# Patient Record
Sex: Female | Born: 1960 | Race: White | Hispanic: No | Marital: Married | Smoking: Never smoker
Health system: Southern US, Community
[De-identification: ages and names within clinical notes are randomized; demographics above are authoritative.]

## PROBLEM LIST (undated history)

## (undated) DIAGNOSIS — D649 Anemia, unspecified: Secondary | ICD-10-CM

## (undated) DIAGNOSIS — D219 Benign neoplasm of connective and other soft tissue, unspecified: Secondary | ICD-10-CM

## (undated) DIAGNOSIS — Z789 Other specified health status: Secondary | ICD-10-CM

## (undated) HISTORY — PX: BUNIONECTOMY: SHX129

---

## 2000-07-02 ENCOUNTER — Encounter: Admission: RE | Admit: 2000-07-02 | Discharge: 2000-07-02 | Payer: Self-pay | Admitting: Sports Medicine

## 2000-11-22 ENCOUNTER — Encounter: Payer: Self-pay | Admitting: Obstetrics and Gynecology

## 2000-11-22 ENCOUNTER — Encounter: Admission: RE | Admit: 2000-11-22 | Discharge: 2000-11-22 | Payer: Self-pay | Admitting: Obstetrics and Gynecology

## 2001-03-06 ENCOUNTER — Encounter: Payer: Self-pay | Admitting: Obstetrics and Gynecology

## 2001-03-06 ENCOUNTER — Encounter: Admission: RE | Admit: 2001-03-06 | Discharge: 2001-03-06 | Payer: Self-pay | Admitting: Obstetrics and Gynecology

## 2004-10-10 ENCOUNTER — Other Ambulatory Visit: Admission: RE | Admit: 2004-10-10 | Discharge: 2004-10-10 | Payer: Self-pay | Admitting: Obstetrics and Gynecology

## 2005-01-09 ENCOUNTER — Ambulatory Visit: Payer: Self-pay | Admitting: Sports Medicine

## 2005-01-23 ENCOUNTER — Ambulatory Visit: Payer: Self-pay | Admitting: Sports Medicine

## 2006-07-09 ENCOUNTER — Ambulatory Visit: Payer: Self-pay | Admitting: Sports Medicine

## 2006-07-09 DIAGNOSIS — M79609 Pain in unspecified limb: Secondary | ICD-10-CM

## 2007-09-03 ENCOUNTER — Ambulatory Visit: Payer: Self-pay | Admitting: Sports Medicine

## 2007-09-03 DIAGNOSIS — M216X9 Other acquired deformities of unspecified foot: Secondary | ICD-10-CM

## 2011-05-31 ENCOUNTER — Encounter (HOSPITAL_BASED_OUTPATIENT_CLINIC_OR_DEPARTMENT_OTHER): Payer: Self-pay | Admitting: Certified Registered Nurse Anesthetist

## 2011-05-31 ENCOUNTER — Encounter (HOSPITAL_BASED_OUTPATIENT_CLINIC_OR_DEPARTMENT_OTHER)
Admission: RE | Disposition: A | Discharge status: Home / Self Care | Payer: Self-pay | Source: Ambulatory Visit | Attending: Orthopedic Surgery

## 2011-05-31 ENCOUNTER — Encounter (HOSPITAL_BASED_OUTPATIENT_CLINIC_OR_DEPARTMENT_OTHER): Payer: Self-pay | Admitting: *Deleted

## 2011-05-31 ENCOUNTER — Ambulatory Visit (HOSPITAL_BASED_OUTPATIENT_CLINIC_OR_DEPARTMENT_OTHER): Payer: Managed Care, Other (non HMO) | Admitting: Certified Registered Nurse Anesthetist

## 2011-05-31 ENCOUNTER — Ambulatory Visit (HOSPITAL_BASED_OUTPATIENT_CLINIC_OR_DEPARTMENT_OTHER)
Admission: RE | Admit: 2011-05-31 | Discharge: 2011-05-31 | Disposition: A | Discharge status: Home / Self Care | Payer: Managed Care, Other (non HMO) | Source: Ambulatory Visit | Attending: Orthopedic Surgery | Admitting: Orthopedic Surgery

## 2011-05-31 DIAGNOSIS — W540XXA Bitten by dog, initial encounter: Secondary | ICD-10-CM | POA: Insufficient documentation

## 2011-05-31 DIAGNOSIS — S5400XA Injury of ulnar nerve at forearm level, unspecified arm, initial encounter: Secondary | ICD-10-CM | POA: Insufficient documentation

## 2011-05-31 DIAGNOSIS — S61209A Unspecified open wound of unspecified finger without damage to nail, initial encounter: Secondary | ICD-10-CM | POA: Insufficient documentation

## 2011-05-31 DIAGNOSIS — L089 Local infection of the skin and subcutaneous tissue, unspecified: Secondary | ICD-10-CM

## 2011-05-31 HISTORY — DX: Other specified health status: Z78.9

## 2011-05-31 HISTORY — PX: I&D EXTREMITY: SHX5045

## 2011-05-31 SURGERY — IRRIGATION AND DEBRIDEMENT EXTREMITY
Anesthesia: Monitor Anesthesia Care | Site: Finger | Laterality: Right | Wound class: Dirty or Infected

## 2011-05-31 MED ORDER — FENTANYL CITRATE 0.05 MG/ML IJ SOLN
INTRAMUSCULAR | Status: DC | PRN
  Administered 2011-05-31: 50 ug via INTRAVENOUS

## 2011-05-31 MED ORDER — ONDANSETRON HCL 4 MG/2ML IJ SOLN
INTRAMUSCULAR | Status: DC | PRN
  Administered 2011-05-31: 4 mg via INTRAVENOUS

## 2011-05-31 MED ORDER — 0.9 % SODIUM CHLORIDE (POUR BTL) OPTIME
TOPICAL | Status: DC | PRN
  Administered 2011-05-31: 100 mL

## 2011-05-31 MED ORDER — DOCUSATE SODIUM 100 MG PO CAPS: 100.0000 mg | ORAL_CAPSULE | Freq: Two times a day (BID) | ORAL | Status: AC

## 2011-05-31 MED ORDER — LIDOCAINE HCL 1 % IJ SOLN
INTRAMUSCULAR | Status: DC | PRN
  Administered 2011-05-31: 16:00:00 via INTRAMUSCULAR

## 2011-05-31 MED ORDER — PROPOFOL 10 MG/ML IV EMUL
INTRAVENOUS | Status: DC | PRN
  Administered 2011-05-31: 75 ug/kg/min via INTRAVENOUS

## 2011-05-31 MED ORDER — LORAZEPAM 2 MG/ML IJ SOLN: 1.0000 mg | Freq: Once | INTRAMUSCULAR | Status: DC | PRN

## 2011-05-31 MED ORDER — HYDROCODONE-ACETAMINOPHEN 5-500 MG PO TABS: 1.0000 | ORAL_TABLET | Freq: Four times a day (QID) | ORAL | Status: AC | PRN

## 2011-05-31 MED ORDER — LACTATED RINGERS IV SOLN
INTRAVENOUS | Status: DC
  Administered 2011-05-31 (×2): via INTRAVENOUS

## 2011-05-31 MED ORDER — MIDAZOLAM HCL 5 MG/5ML IJ SOLN
INTRAMUSCULAR | Status: DC | PRN
  Administered 2011-05-31: 2 mg via INTRAVENOUS

## 2011-05-31 MED ORDER — FENTANYL CITRATE 0.05 MG/ML IJ SOLN: 50.0000 ug | INTRAMUSCULAR | Status: DC | PRN

## 2011-05-31 MED ORDER — MIDAZOLAM HCL 2 MG/2ML IJ SOLN: 1.0000 mg | INTRAMUSCULAR | Status: DC | PRN

## 2011-05-31 MED ORDER — CEFAZOLIN SODIUM 1-5 GM-% IV SOLN
INTRAVENOUS | Status: DC | PRN
  Administered 2011-05-31: 1 g via INTRAVENOUS

## 2011-05-31 MED ORDER — HYDROMORPHONE HCL PF 1 MG/ML IJ SOLN: 0.2500 mg | INTRAMUSCULAR | Status: DC | PRN

## 2011-05-31 MED ORDER — LIDOCAINE HCL (CARDIAC) 20 MG/ML IV SOLN
INTRAVENOUS | Status: DC | PRN
  Administered 2011-05-31: 60 mg via INTRAVENOUS

## 2011-05-31 SURGICAL SUPPLY — 48 items
BANDAGE CONFORM 3  STR LF (GAUZE/BANDAGES/DRESSINGS) ×2 IMPLANT
BANDAGE ELASTIC 3 VELCRO ST LF (GAUZE/BANDAGES/DRESSINGS) IMPLANT
BANDAGE GAUZE ELAST BULKY 4 IN (GAUZE/BANDAGES/DRESSINGS) ×2 IMPLANT
BLADE SURG 15 STRL LF DISP TIS (BLADE) ×1 IMPLANT
BLADE SURG 15 STRL SS (BLADE) ×1
BNDG COHESIVE 1X5 TAN STRL LF (GAUZE/BANDAGES/DRESSINGS) ×2 IMPLANT
BNDG COHESIVE 3X5 TAN STRL LF (GAUZE/BANDAGES/DRESSINGS) IMPLANT
BNDG ESMARK 4X9 LF (GAUZE/BANDAGES/DRESSINGS) IMPLANT
BRUSH SCRUB EZ PLAIN DRY (MISCELLANEOUS) ×2 IMPLANT
CANISTER SUCTION 1200CC (MISCELLANEOUS) ×2 IMPLANT
CLOTH BEACON ORANGE TIMEOUT ST (SAFETY) ×2 IMPLANT
CORDS BIPOLAR (ELECTRODE) ×2 IMPLANT
COVER MAYO STAND STRL (DRAPES) ×2 IMPLANT
COVER TABLE BACK 60X90 (DRAPES) ×2 IMPLANT
CUFF TOURNIQUET SINGLE 18IN (TOURNIQUET CUFF) ×2 IMPLANT
DECANTER SPIKE VIAL GLASS SM (MISCELLANEOUS) ×2 IMPLANT
DRAIN PENROSE 1/4X12 LTX STRL (WOUND CARE) IMPLANT
DRAPE EXTREMITY T 121X128X90 (DRAPE) ×2 IMPLANT
DRAPE SURG 17X23 STRL (DRAPES) ×2 IMPLANT
DRSG EMULSION OIL 3X3 NADH (GAUZE/BANDAGES/DRESSINGS) ×2 IMPLANT
GAUZE SPONGE 4X4 16PLY XRAY LF (GAUZE/BANDAGES/DRESSINGS) IMPLANT
GLOVE BIO SURGEON STRL SZ 6.5 (GLOVE) ×2 IMPLANT
GLOVE BIO SURGEON STRL SZ8 (GLOVE) ×2 IMPLANT
GLOVE BIOGEL M STRL SZ7.5 (GLOVE) ×2 IMPLANT
GOWN PREVENTION PLUS XLARGE (GOWN DISPOSABLE) IMPLANT
GOWN PREVENTION PLUS XXLARGE (GOWN DISPOSABLE) IMPLANT
LOOP VESSEL MAXI BLUE (MISCELLANEOUS) IMPLANT
NEEDLE 27GAX1X1/2 (NEEDLE) ×2 IMPLANT
NEEDLE HYPO 22GX1.5 SAFETY (NEEDLE) IMPLANT
NEEDLE HYPO 25X1 1.5 SAFETY (NEEDLE) IMPLANT
PACK BASIN DAY SURGERY FS (CUSTOM PROCEDURE TRAY) ×2 IMPLANT
PAD ALCOHOL SWAB (MISCELLANEOUS) ×2 IMPLANT
PAD CAST 3X4 CTTN HI CHSV (CAST SUPPLIES) ×1 IMPLANT
PADDING CAST ABS 4INX4YD NS (CAST SUPPLIES)
PADDING CAST ABS COTTON 4X4 ST (CAST SUPPLIES) IMPLANT
PADDING CAST COTTON 3X4 STRL (CAST SUPPLIES) ×1
SPLINT FIBERGLASS 3X35 (CAST SUPPLIES) IMPLANT
SPLINT PLASTER CAST XFAST 3X15 (CAST SUPPLIES) IMPLANT
SPLINT PLASTER XTRA FASTSET 3X (CAST SUPPLIES)
STOCKINETTE 4X48 STRL (DRAPES) ×2 IMPLANT
SUT PROLENE 4 0 PS 2 18 (SUTURE) ×2 IMPLANT
SUT VIC AB 4-0 SH 27 (SUTURE)
SUT VIC AB 4-0 SH 27XANBCTRL (SUTURE) IMPLANT
SYR BULB 3OZ (MISCELLANEOUS) ×2 IMPLANT
SYR CONTROL 10ML LL (SYRINGE) ×2 IMPLANT
TOWEL OR 17X24 6PK STRL BLUE (TOWEL DISPOSABLE) ×2 IMPLANT
TRAY DSU PREP LF (CUSTOM PROCEDURE TRAY) ×2 IMPLANT
UNDERPAD 30X30 INCONTINENT (UNDERPADS AND DIAPERS) ×2 IMPLANT

## 2011-05-31 NOTE — Discharge Instructions (Signed)
KEEP BANDAGE CLEAN AND DRY CALL OFFICE FOR F/U APPT [phone] on 06/01/2011  Need to see therapist tomorrow for wound care Ext 1601 KEEP HAND ELEVATED ABOVE HEART OK TO APPLY ICE TO OPERATIVE AREA CONTACT OFFICE IF ANY WORSENING PAIN OR CONCERNS. Post Anesthesia Home Care Instructions  Activity: Get plenty of rest for the remainder of the day. A responsible adult should stay with you for 24 hours following the procedure.  For the next 24 hours, DO NOT: -Drive a car -Advertising copywriter -Drink alcoholic beverages -Take any medication unless instructed by your physician -Make any legal decisions or sign important papers.  Meals: Start with liquid foods such as gelatin or soup. Progress to regular foods as tolerated. Avoid greasy, spicy, heavy foods. If nausea and/or vomiting occur, drink only clear liquids until the nausea and/or vomiting subsides. Call your physician if vomiting continues.  Special Instructions/Symptoms: Your throat may feel dry or sore from the anesthesia or the breathing tube placed in your throat during surgery. If this causes discomfort, gargle with warm salt water. The discomfort should disappear within 24 hours.

## 2011-05-31 NOTE — H&P (Signed)
URI COVEY is an 51 y.o. female.   Chief Complaint: RIGHT INDEX FINGER PAIN AND SWELLING HPI: DOG BITE SEVERAL DAYS AGO WITH WORSENING PAIN AND SWELLING SEEN IN OFFICE TODAY AND CONCERNED ABOUT INFECTION NO PREVIOUS INJURY TO FINGER PT HERE FOR SURGERY SCHEDULED ON URGENT BASIS GIVEN CONCERN FOR INFECTION  Past Medical History  Diagnosis Date  . No pertinent past medical history     Past Surgical History  Procedure Date  . Cesarean section   . Bunionectomy     History reviewed. No pertinent family history. Social History:  reports that she has never smoked. She has never used smokeless tobacco. She reports that she drinks about 3 ounces of alcohol per week. She reports that she does not use illicit drugs.  Allergies: No Known Allergies  Medications Prior to Admission  Medication Sig Dispense Refill  . amoxicillin-clavulanate (AUGMENTIN) 875-125 MG per tablet Take 1 tablet by mouth 2 (two) times daily.      . Multiple Vitamins-Minerals (MULTIVITAMIN WITH MINERALS) tablet Take 1 tablet by mouth daily.      . traMADol (ULTRAM) 50 MG tablet Take 50 mg by mouth every 6 (six) hours as needed.        No results found for this or any previous visit (from the past 48 hour(s)). No results found.  NO RECENT ILLNESSES OR HOSPITALIZATIONS  Blood pressure 137/82, pulse 66, temperature 98.5 F (36.9 C), temperature source Oral, resp. rate 16, height 5\' 2"  (1.575 m), weight 53.071 kg (117 lb), last menstrual period 05/06/2011, SpO2 98.00%. General Appearance:  Alert, cooperative, no distress, appears stated age  Head:  Normocephalic, without obvious abnormality, atraumatic  Eyes:  Pupils equal, conjunctiva/corneas clear,         Throat: Lips, mucosa, and tongue normal; teeth and gums normal  Neck: No visible masses     Lungs:   respirations unlabored  Chest Wall:  No tenderness or deformity  Heart:  Regular rate and rhythm,  Abdomen:   Soft, non-tender,         Extremities:  RIGHT INDEX AND RING FINGER IN DRESSINGS, PT WAS EXAMINED IN OFFICE TODAY.   Pulses: 2+ and symmetric  Skin: Skin color, texture, turgor normal, no rashes or lesions     Neurologic: Normal    Assessment/Plan RIGHT INDEX FINGER DOG BITE WITH INFECTION  RIGHT INDEX FINGER IRRIGATION AND DEBRIDEMENT  R/B/A DISCUSSED WITH PT IN OFFICE.  PT VOICED UNDERSTANDING OF PLAN CONSENT SIGNED DAY OF SURGERY PT SEEN AND EXAMINED PRIOR TO OPERATIVE PROCEDURE/DAY OF SURGERY SITE MARKED. QUESTIONS ANSWERED WILL GO HOME FOLLOWING SURGERY  Sharma Covert 05/31/2011, 2:36 PM

## 2011-05-31 NOTE — Brief Op Note (Signed)
05/31/2011  4:54 PM  PATIENT:  Sylvia Henderson  51 y.o. female  PRE-OPERATIVE DIAGNOSIS:  Infected dog bites right index and ring finger  POST-OPERATIVE DIAGNOSIS:  Infected dog bites right index and ring finger  PROCEDURE:  Procedure(s) (LRB): IRRIGATION AND DEBRIDEMENT EXTREMITY (Right)  SURGEON:  Surgeon(s) and Role:    * Sharma Covert, MD - Primary  PHYSICIAN ASSISTANT:   ASSISTANTS: none   ANESTHESIA:   MAC  EBL:  Total I/O In: 900 [I.V.:900] Out: -   BLOOD ADMINISTERED:none  DRAINS: none   LOCAL MEDICATIONS USED:  MARCAINE     SPECIMEN:  No Specimen  DISPOSITION OF SPECIMEN:  N/A  COUNTS:  YES  TOURNIQUET:   Total Tourniquet Time Documented: Forearm (Right) - 9 minutes  DICTATION: .Other Dictation: Dictation Number [id number]  PLAN OF CARE: Discharge to home after PACU  PATIENT DISPOSITION:  PACU - hemodynamically stable.   Delay start of Pharmacological VTE agent (>24hrs) due to surgical blood loss or risk of bleeding: not applicable

## 2011-05-31 NOTE — Anesthesia Procedure Notes (Signed)
Procedure Name: MAC Date/Time: 05/31/2011 4:14 PM Performed by: Areej Tayler D Pre-anesthesia Checklist: Patient identified, Emergency Drugs available, Suction available, Patient being monitored and Timeout performed Patient Re-evaluated:Patient Re-evaluated prior to inductionOxygen Delivery Method: Simple face mask

## 2011-05-31 NOTE — Anesthesia Postprocedure Evaluation (Signed)
  Anesthesia Post-op Note  Patient: Sylvia Henderson  Procedure(s) Performed: Procedure(s) (LRB): IRRIGATION AND DEBRIDEMENT EXTREMITY (Right)  Patient Location: PACU  Anesthesia Type: MAC  Level of Consciousness: awake  Airway and Oxygen Therapy: Patient Spontanous Breathing  Post-op Pain: mild  Post-op Assessment: Post-op Vital signs reviewed, Patient's Cardiovascular Status Stable, Respiratory Function Stable, Patent Airway, No signs of Nausea or vomiting, Adequate PO intake and Pain level controlled  Post-op Vital Signs: stable  Complications: No apparent anesthesia complications

## 2011-05-31 NOTE — Transfer of Care (Signed)
Immediate Anesthesia Transfer of Care Note  Patient: Sylvia Henderson  Procedure(s) Performed: Procedure(s) (LRB): IRRIGATION AND DEBRIDEMENT EXTREMITY (Right)  Patient Location: PACU  Anesthesia Type: MAC  Level of Consciousness: awake, alert , oriented and pateint uncooperative  Airway & Oxygen Therapy: Patient Spontanous Breathing  Post-op Assessment: Report given to PACU RN and Post -op Vital signs reviewed and stable  Post vital signs: Reviewed and stable  Complications: No apparent anesthesia complications

## 2011-05-31 NOTE — Anesthesia Preprocedure Evaluation (Addendum)
Anesthesia Evaluation  Patient identified by MRN, date of birth, ID band Patient awake    Reviewed: Allergy & Precautions, H&P , NPO status , Patient's Chart, lab work & pertinent test results  Airway Mallampati: I TM Distance: >3 FB Neck ROM: Full    Dental   Pulmonary    Pulmonary exam normal       Cardiovascular     Neuro/Psych    GI/Hepatic   Endo/Other    Renal/GU      Musculoskeletal   Abdominal   Peds  Hematology   Anesthesia Other Findings   Reproductive/Obstetrics                           Anesthesia Physical Anesthesia Plan  ASA: I  Anesthesia Plan: MAC   Post-op Pain Management:    Induction: Intravenous  Airway Management Planned: Simple Face Mask  Additional Equipment:   Intra-op Plan:   Post-operative Plan:   Informed Consent: I have reviewed the patients History and Physical, chart, labs and discussed the procedure including the risks, benefits and alternatives for the proposed anesthesia with the patient or authorized representative who has indicated his/her understanding and acceptance.     Plan Discussed with: CRNA and Surgeon  Anesthesia Plan Comments:         Anesthesia Quick Evaluation

## 2011-06-01 ENCOUNTER — Encounter (HOSPITAL_BASED_OUTPATIENT_CLINIC_OR_DEPARTMENT_OTHER): Payer: Self-pay | Admitting: Orthopedic Surgery

## 2011-06-01 LAB — POCT HEMOGLOBIN-HEMACUE: Hemoglobin: 13.9 g/dL (ref 12.0–15.0)

## 2011-06-01 NOTE — Op Note (Signed)
NAMEBRIZEYDA, HOLTMEYER NO.:  [id number]  MEDICAL RECORD NO.:  [id number]  LOCATION:                                 FACILITY:  PHYSICIAN:  Madelynn Done, MD       DATE OF BIRTH:  DATE OF PROCEDURE:  05/31/2011 DATE OF DISCHARGE:                              OPERATIVE REPORT   PREOPERATIVE DIAGNOSIS:  Right index finger dog bite with infection.  POSTOPERATIVE DIAGNOSIS:  Right index finger dog bite with infection.  ATTENDING PHYSICIAN:  Sharma Covert IV, MD, who scrubbed and present for the entire procedure.  ASSISTANT SURGEON:  None.  ANESTHESIA:  1% Xylocaine, 0.25% Marcaine local block with IV sedation.  SURGICAL INDICATIONS:  Mrs. Bankson is a 51 year old female who sustained a dog bite.  The patient presented to the office with worsening infection to the index finger.  The patient was recommended to undergo the above procedure.  Risks, benefits, and alternatives were discussed in detail with the patient.  Signed informed consent was obtained.  Risks include, but not limited to bleeding, infection, damage to nearby nerves, arteries, or tendons, loss of motion of wrist and digits, and need for further surgical intervention.  SURGICAL PROCEDURE: 1. Right index finger debridement of skin and subcutaneous tissue. 2. Right index finger flexor sheath exploration and drainage. 3. Right index finger ulnar digital nerve neurolysis. 4. Right index finger distal interphalangeal joint arthrotomy and     drainage.  DESCRIPTION OF PROCEDURE:  The patient was properly identified in the preoperative holding area, marked with a permanent marker made on the right index finger to indicate the correct operative site.  The patient was then brought back to the operating room and placed supine on the anesthesia room table with MAC anesthesia was performed.  Local anesthetic was performed, the patient tolerated this well.  A well- padded tourniquet was then placed in  the right forearm and sealed with 1000-drape.  The right upper extremity was then prepped and draped in normal sterile fashion.  Time-out was called, correct side was identified and the procedure was then begun.  Attention was then turned to the right index finger.  The open wound that was over the volar aspect of the distal interphalangeal joint was then extended proximally. The tourniquet was then insufflated.  Dissection was then carried down through the skin and subcutaneous tissues.  Following this, the flexor sheath had been invaded by the dog bite.  The patient did sustain injury to the FDP, this was a superficial tearing of the FDP and this was debrided back to a stable rim to prevent any type of triggering.  After excisional debridement of the FDP tendon, the skin and subcutaneous tissue were then sharply debrided with a sharp scissors and a knife. After excisional debridement was carried out, drainage of the flexor sheath was then carried out.  After thorough irrigation and drainage of the flexor sheath, the patient did have a competent radial neurovascular bundle, but did have the ulnar nerve was markedly edematous, appeared the median zone of injury though the ulnar digital nerve.  Careful dissection was then carried out to freeing the nerve from the area. Digital nerve  neurolysis was carried out.  Following this, the wound was then thoroughly irrigated.  Attention was then turned to the dorsal aspect.  The other puncture wound was then approached through, this extended all the way down to the distal interphalangeal joint.  Opening of the distal interphalangeal joint was then further extended and the joint was then thoroughly irrigated.  The joint lavage was then carried out of the arthrotomy, copious irrigation done throughout.  Tourniquet deflated.  There was good fusion of fingertip.  The wounds were then loosely reapproximated with two 4-0 Prolene sutures.  Adaptic  dressing and sterile compressive bandage was then applied.  The patient tolerated the procedure well, returned to recovery room in good condition.  POSTOPERATIVE PLAN:  The patient was discharged to home, see me back in the office tomorrow for wound care, whirlpool.  I plan to see her back within 4 days for repeat wound check and eval.  Continue on the oral antibiotics and pain medication.     Madelynn Done, MD     FWO/MEDQ  D:  05/31/2011  T:  05/31/2011  Job:  [id number]

## 2013-02-10 ENCOUNTER — Telehealth: Payer: Self-pay | Admitting: Oncology

## 2013-02-10 NOTE — Telephone Encounter (Signed)
C/D 02/10/13 for appt. 02/11/13

## 2013-02-10 NOTE — Telephone Encounter (Signed)
NEW PATIENT SCHEDULED FOR 02/11 @ 10:30 W/DR. SHADAD.  [place] PACKET MAILED.

## 2013-02-11 ENCOUNTER — Ambulatory Visit: Payer: Managed Care, Other (non HMO) | Admitting: Oncology

## 2013-02-11 ENCOUNTER — Ambulatory Visit: Payer: Managed Care, Other (non HMO)

## 2013-02-11 ENCOUNTER — Other Ambulatory Visit: Payer: Managed Care, Other (non HMO)

## 2013-03-04 ENCOUNTER — Other Ambulatory Visit (HOSPITAL_COMMUNITY)
Admission: RE | Admit: 2013-03-04 | Discharge: 2013-03-04 | Disposition: A | Discharge status: Home / Self Care | Payer: Managed Care, Other (non HMO) | Source: Ambulatory Visit | Attending: Obstetrics & Gynecology | Admitting: Obstetrics & Gynecology

## 2013-03-04 ENCOUNTER — Other Ambulatory Visit: Payer: Self-pay | Admitting: Obstetrics & Gynecology

## 2013-03-04 DIAGNOSIS — Z01419 Encounter for gynecological examination (general) (routine) without abnormal findings: Secondary | ICD-10-CM | POA: Insufficient documentation

## 2013-03-04 DIAGNOSIS — Z1151 Encounter for screening for human papillomavirus (HPV): Secondary | ICD-10-CM | POA: Insufficient documentation

## 2013-11-03 ENCOUNTER — Other Ambulatory Visit: Payer: Self-pay | Admitting: Obstetrics & Gynecology

## 2013-11-03 DIAGNOSIS — D259 Leiomyoma of uterus, unspecified: Secondary | ICD-10-CM

## 2013-11-09 ENCOUNTER — Other Ambulatory Visit: Payer: Self-pay | Admitting: Obstetrics & Gynecology

## 2013-11-09 DIAGNOSIS — D259 Leiomyoma of uterus, unspecified: Secondary | ICD-10-CM

## 2013-11-10 ENCOUNTER — Ambulatory Visit
Admission: RE | Admit: 2013-11-10 | Discharge: 2013-11-10 | Disposition: A | Discharge status: Home / Self Care | Payer: Managed Care, Other (non HMO) | Source: Ambulatory Visit | Attending: Obstetrics & Gynecology | Admitting: Obstetrics & Gynecology

## 2013-11-10 DIAGNOSIS — D259 Leiomyoma of uterus, unspecified: Secondary | ICD-10-CM

## 2013-11-10 HISTORY — DX: Benign neoplasm of connective and other soft tissue, unspecified: D21.9

## 2013-11-10 HISTORY — DX: Anemia, unspecified: D64.9

## 2013-11-10 NOTE — Consult Note (Signed)
Chief Complaint: Chief Complaint  Patient presents with  . Advice Only    Consult for [place]    Referring Physician(s): Ozan,Jennifer M  History of Present Illness: Sylvia Henderson is a 53 y.o. female presenting in consultation for a potential uterine fibroid embolization.   The patient has been experiencing heavy bleeding during her regular cycles, starting in 2013. She states that her menses lasts usually as long or longer than 7 days, with a most recent episode of continual bleeding with clots for nearly 3 weeks.  She reports needing to use both tampons and pads simultaneously.  She denies any painful bleeding. Other than this most recent episode of nearly continuous bleeding for 3 weeks, she reports regular periods without interval bleeding.  She does report bulk classification symptoms, being able to palpate the uterus/fibroids. She also reports she has been having increasing frequency with urination, and heaviness in her pelvis. She denies any pain that she can attribute to her fibroids..  The patient participates in competitive triathlon and marathon running. Her symptoms have become lifestyle limiting, given that she finds herself bleeding during her workouts and competitions. She arrives for the interview having researched uterine fibroid embolization as an alternative to hysterectomy, given her previous experiences with prior surgeries.  The patient has 2 children, who are grown age, and does not desire further pregnancies.  Past Medical History  Diagnosis Date  . No pertinent past medical history   . Fibroids   . Anemia     Past Surgical History  Procedure Laterality Date  . Cesarean section    . Bunionectomy    . I&d extremity  05/31/2011    Procedure: IRRIGATION AND DEBRIDEMENT EXTREMITY;  Surgeon: Linna Hoff, MD;  Location: [facility];  Service: Orthopedics;  Laterality: Right;  Irrigation and Debridement Right index finger    Allergies: Review  of patient's allergies indicates no known allergies.  She denies any knowledge of a known contrast allergy.  Medications: Prior to Admission medications   Medication Sig Start Date End Date Taking? Authorizing Provider  Multiple Vitamins-Minerals (MULTIVITAMIN WITH MINERALS) tablet Take 1 tablet by mouth daily.   Yes Historical Provider, MD  amoxicillin-clavulanate (AUGMENTIN) 875-125 MG per tablet Take 1 tablet by mouth 2 (two) times daily.    Historical Provider, MD    No family history on file.  History   Social History  . Marital Status: Married    Spouse Name: N/A    Number of Children: N/A  . Years of Education: N/A   Social History Main Topics  . Smoking status: Never Smoker   . Smokeless tobacco: Never Used  . Alcohol Use: 3.0 oz/week    5 Glasses of wine per week  . Drug Use: No  . Sexual Activity: Not on file   Other Topics Concern  . Not on file   Social History Narrative    ECOG Status: 1 - Symptomatic.   Review of Systems: A 12 point ROS discussed and pertinent positives are indicated in the HPI above.  All other systems are negative.  Review of Systems  All other systems reviewed and are negative.   Vital Signs: BP 145/62 mmHg  Pulse 71  Temp(Src) 97.7 F (36.5 C) (Oral)  Resp 14  Ht 5\' 2"  (1.575 m)  Wt 120 lb (54.432 kg)  BMI 21.94 kg/m2  SpO2 100%  Physical Exam  Constitutional: She is oriented to person, place, and time. She appears well-developed and well-nourished.  HENT:  Head: Normocephalic and atraumatic.  Nose: Nose normal.  Mouth/Throat: Oropharynx is clear and moist.  Eyes: Conjunctivae are normal. Pupils are equal, round, and reactive to light.  Neck: Normal range of motion. Neck supple. No JVD present. No tracheal deviation present. No thyromegaly present.  Cardiovascular: Normal rate, regular rhythm, normal heart sounds and intact distal pulses.  Exam reveals no gallop and no friction rub.   No murmur heard. Pulmonary/Chest:  Breath sounds normal. No stridor. No respiratory distress. She has no wheezes. She has no rales.  Abdominal: Soft. Bowel sounds are normal. She exhibits mass. She exhibits no distension. There is tenderness. There is no rebound and no guarding. No hernia.  Lymphadenopathy:    She has no cervical adenopathy.  Neurological: She is alert and oriented to person, place, and time.  Skin: Skin is warm and dry.  Psychiatric: She has a normal mood and affect. Her behavior is normal. Thought content normal.  Nursing note and vitals reviewed.   Imaging: A pelvic US was performed in her OB/GYN office, though the images are not available for viewing.  This report states that the endometrial canal was not visualized, and that there appeared to be a dominant posterior fibroid.   Labs:  CBC: No results for input(s): WBC, HGB, HCT, PLT in the last 8760 hours.  COAGS: No results for input(s): INR, APTT in the last 8760 hours.  BMP: No results for input(s): NA, K, CL, CO2, GLUCOSE, BUN, CALCIUM, CREATININE, GFRNONAA, GFRAA in the last 8760 hours.  Invalid input(s): CMP  LIVER FUNCTION TESTS: No results for input(s): BILITOT, AST, ALT, ALKPHOS, PROT, ALBUMIN in the last 8760 hours.   Assessment and Plan:  Our patient is a very pleasant 53 year old, premenopausal female with 2 of the 3 classes of symptoms related to uterine fibroids. Her main symptom is abnormal bleeding (menorrhagia), with secondary bulk type symptoms.  I had a long discussion with Ms. Greenlaw regarding the pathology, pathophysiology, natural history, and possible treatment options of her uterine fibroids. Treatment options which were discussed included medical/hormone therapy, surgical, uterine fibroid embolization, and MR directed high-frequency ultrasound.  She desires no further pregnancies, and would prefer a nonsurgical option for treatment of her fibroids. Thus, she is a good candidate for uterine fibroid embolization.  A  full and complete informed consent was discussed with the patient, including the risks of uterine fibroid embolization. Specifically, the risks discussed or bleeding, infection, access site complications, contrast reaction, post embolization syndrome, pain, infection/endometritis, tissue/fluid passage, amenorrhea/early menopause, DVT, and potential need for hysterectomy.  Given her desire to proceed, we will order an MRI to evaluate the burden of fibroids, specifically location within the  uterine tissue, and possibility of pedunculated fibroids.  After completion of her MRI, she would be a potential candidate to proceed with the uterine fibroid embolization before December 31.  Thank you for this interesting consult.  I greatly enjoyed meeting Sylvia Henderson and look forward to participating in their care.  I was able to spend approximally 60 minutes with the patient in clinical consultation, with greater than 50% of time in counseling and coordination of care regarding her symptomatic uterine fibroids.      SignedJohny Shears 11/10/2013, 12:21 PM

## 2013-11-16 ENCOUNTER — Ambulatory Visit
Admission: RE | Admit: 2013-11-16 | Discharge: 2013-11-16 | Disposition: A | Discharge status: Home / Self Care | Payer: Managed Care, Other (non HMO) | Source: Ambulatory Visit | Attending: Obstetrics & Gynecology | Admitting: Obstetrics & Gynecology

## 2013-11-16 DIAGNOSIS — D259 Leiomyoma of uterus, unspecified: Secondary | ICD-10-CM

## 2013-11-16 MED ORDER — GADOBENATE DIMEGLUMINE 529 MG/ML IV SOLN
10.0000 mL | Freq: Once | INTRAVENOUS | Status: AC | PRN
  Administered 2013-11-16: 10 mL via INTRAVENOUS

## 2013-11-17 ENCOUNTER — Telehealth: Payer: Self-pay | Admitting: Radiology

## 2013-11-17 NOTE — Telephone Encounter (Signed)
Disregard

## 2013-12-02 ENCOUNTER — Other Ambulatory Visit: Payer: Self-pay | Admitting: Interventional Radiology

## 2013-12-02 DIAGNOSIS — D251 Intramural leiomyoma of uterus: Secondary | ICD-10-CM

## 2013-12-17 ENCOUNTER — Other Ambulatory Visit: Payer: Self-pay | Admitting: Radiology

## 2013-12-18 ENCOUNTER — Encounter (HOSPITAL_COMMUNITY): Payer: Self-pay

## 2013-12-18 ENCOUNTER — Ambulatory Visit (HOSPITAL_COMMUNITY)
Admission: RE | Admit: 2013-12-18 | Discharge: 2013-12-18 | Disposition: A | Discharge status: Home / Self Care | Payer: Managed Care, Other (non HMO) | Source: Ambulatory Visit | Attending: Interventional Radiology | Admitting: Interventional Radiology

## 2013-12-18 ENCOUNTER — Observation Stay (HOSPITAL_COMMUNITY)
Admission: RE | Admit: 2013-12-18 | Discharge: 2013-12-19 | Disposition: A | Discharge status: Home / Self Care | Payer: Managed Care, Other (non HMO) | Source: Ambulatory Visit | Attending: Interventional Radiology | Admitting: Interventional Radiology

## 2013-12-18 DIAGNOSIS — D251 Intramural leiomyoma of uterus: Secondary | ICD-10-CM | POA: Diagnosis not present

## 2013-12-18 DIAGNOSIS — N920 Excessive and frequent menstruation with regular cycle: Secondary | ICD-10-CM | POA: Insufficient documentation

## 2013-12-18 DIAGNOSIS — D259 Leiomyoma of uterus, unspecified: Secondary | ICD-10-CM | POA: Diagnosis present

## 2013-12-18 LAB — CBC WITH DIFFERENTIAL/PLATELET
Basophils Absolute: 0.1 10*3/uL (ref 0.0–0.1)
Basophils Relative: 1 % (ref 0–1)
EOS PCT: 6 % — AB (ref 0–5)
Eosinophils Absolute: 0.4 10*3/uL (ref 0.0–0.7)
HCT: 32.5 % — ABNORMAL LOW (ref 36.0–46.0)
Hemoglobin: 10.6 g/dL — ABNORMAL LOW (ref 12.0–15.0)
LYMPHS ABS: 1.8 10*3/uL (ref 0.7–4.0)
LYMPHS PCT: 27 % (ref 12–46)
MCH: 28 pg (ref 26.0–34.0)
MCHC: 32.6 g/dL (ref 30.0–36.0)
MCV: 86 fL (ref 78.0–100.0)
Monocytes Absolute: 0.8 10*3/uL (ref 0.1–1.0)
Monocytes Relative: 12 % (ref 3–12)
Neutro Abs: 3.5 10*3/uL (ref 1.7–7.7)
Neutrophils Relative %: 54 % (ref 43–77)
PLATELETS: 341 10*3/uL (ref 150–400)
RBC: 3.78 MIL/uL — AB (ref 3.87–5.11)
RDW: 15.5 % (ref 11.5–15.5)
WBC: 6.5 10*3/uL (ref 4.0–10.5)

## 2013-12-18 LAB — BASIC METABOLIC PANEL
Anion gap: 12 (ref 5–15)
BUN: 16 mg/dL (ref 6–23)
CO2: 23 meq/L (ref 19–32)
Calcium: 9.2 mg/dL (ref 8.4–10.5)
Chloride: 104 mEq/L (ref 96–112)
Creatinine, Ser: 0.93 mg/dL (ref 0.50–1.10)
GFR calc Af Amer: 80 mL/min — ABNORMAL LOW (ref >90)
GFR, EST NON AFRICAN AMERICAN: 69 mL/min — AB (ref >90)
GLUCOSE: 92 mg/dL (ref 70–99)
POTASSIUM: 4 meq/L (ref 3.7–5.3)
SODIUM: 139 meq/L (ref 137–147)

## 2013-12-18 LAB — PROTIME-INR
INR: 1.11 (ref 0.00–1.49)
PROTHROMBIN TIME: 14.4 s (ref 11.6–15.2)

## 2013-12-18 LAB — HCG, SERUM, QUALITATIVE: PREG SERUM: NEGATIVE

## 2013-12-18 LAB — APTT: aPTT: 27 seconds (ref 24–37)

## 2013-12-18 MED ORDER — HYDROCODONE-ACETAMINOPHEN 5-325 MG PO TABS
1.0000 | ORAL_TABLET | ORAL | Status: DC | PRN
  Administered 2013-12-19: 2 via ORAL
  Filled 2013-12-18: qty 2

## 2013-12-18 MED ORDER — IOHEXOL 300 MG/ML  SOLN
80.0000 mL | Freq: Once | INTRAMUSCULAR | Status: AC | PRN
  Administered 2013-12-18: 1 mL via INTRA_ARTERIAL

## 2013-12-18 MED ORDER — NITROGLYCERIN 1 MG/10 ML FOR IR/CATH LAB
INTRA_ARTERIAL | Status: AC | PRN
  Administered 2013-12-18: 150 ug via INTRA_ARTERIAL
  Administered 2013-12-18: 50 ug via INTRA_ARTERIAL

## 2013-12-18 MED ORDER — CEFAZOLIN SODIUM-DEXTROSE 2-3 GM-% IV SOLR
INTRAVENOUS | Status: AC
  Administered 2013-12-18: 2 g via INTRAVENOUS
  Filled 2013-12-18: qty 50

## 2013-12-18 MED ORDER — CEFAZOLIN SODIUM-DEXTROSE 2-3 GM-% IV SOLR
2.0000 g | INTRAVENOUS | Status: AC
  Administered 2013-12-18: 2 g via INTRAVENOUS

## 2013-12-18 MED ORDER — PROMETHAZINE HCL 25 MG PO TABS: 25.0000 mg | ORAL_TABLET | Freq: Once | ORAL | Status: DC

## 2013-12-18 MED ORDER — KETOROLAC TROMETHAMINE 60 MG/2ML IM SOLN
60.0000 mg | Freq: Once | INTRAMUSCULAR | Status: AC
  Filled 2013-12-18: qty 2

## 2013-12-18 MED ORDER — SODIUM CHLORIDE 0.9 % IJ SOLN: 3.0000 mL | INTRAMUSCULAR | Status: DC | PRN

## 2013-12-18 MED ORDER — HYDROMORPHONE HCL 2 MG/ML IJ SOLN
INTRAMUSCULAR | Status: AC
  Filled 2013-12-18: qty 1

## 2013-12-18 MED ORDER — FENTANYL CITRATE 0.05 MG/ML IJ SOLN
INTRAMUSCULAR | Status: AC
  Filled 2013-12-18: qty 4

## 2013-12-18 MED ORDER — SODIUM CHLORIDE 0.9 % IJ SOLN: 9.0000 mL | INTRAMUSCULAR | Status: DC | PRN

## 2013-12-18 MED ORDER — LIDOCAINE HCL 1 % IJ SOLN
INTRAMUSCULAR | Status: AC
  Filled 2013-12-18: qty 20

## 2013-12-18 MED ORDER — PROMETHAZINE HCL 25 MG RE SUPP: 25.0000 mg | Freq: Once | RECTAL | Status: DC

## 2013-12-18 MED ORDER — KETOROLAC TROMETHAMINE 30 MG/ML IJ SOLN
30.0000 mg | Freq: Four times a day (QID) | INTRAMUSCULAR | Status: AC
  Administered 2013-12-18 – 2013-12-19 (×3): 30 mg via INTRAVENOUS
  Filled 2013-12-18 (×3): qty 1

## 2013-12-18 MED ORDER — SODIUM CHLORIDE 0.9 % IV SOLN
INTRAVENOUS | Status: DC
  Administered 2013-12-18: 08:00:00 via INTRAVENOUS

## 2013-12-18 MED ORDER — HYDROMORPHONE 0.3 MG/ML IV SOLN
INTRAVENOUS | Status: AC
  Filled 2013-12-18: qty 25

## 2013-12-18 MED ORDER — NALOXONE HCL 0.4 MG/ML IJ SOLN
0.4000 mg | INTRAMUSCULAR | Status: DC | PRN
Start: 2013-12-18 — End: 2013-12-19

## 2013-12-18 MED ORDER — BUPIVACAINE HCL (PF) 0.25 % IJ SOLN
INTRAMUSCULAR | Status: AC
  Filled 2013-12-18: qty 30

## 2013-12-18 MED ORDER — DOCUSATE SODIUM 100 MG PO CAPS
100.0000 mg | ORAL_CAPSULE | Freq: Two times a day (BID) | ORAL | Status: DC
  Administered 2013-12-18 – 2013-12-19 (×2): 100 mg via ORAL
  Filled 2013-12-18 (×3): qty 1

## 2013-12-18 MED ORDER — SODIUM CHLORIDE 0.9 % IV SOLN: 250.0000 mL | INTRAVENOUS | Status: DC | PRN

## 2013-12-18 MED ORDER — MIDAZOLAM HCL 2 MG/2ML IJ SOLN
INTRAMUSCULAR | Status: AC
  Filled 2013-12-18: qty 6

## 2013-12-18 MED ORDER — KETOROLAC TROMETHAMINE 30 MG/ML IJ SOLN
30.0000 mg | Freq: Once | INTRAMUSCULAR | Status: AC
  Administered 2013-12-18: 30 mg via INTRAVENOUS

## 2013-12-18 MED ORDER — ONDANSETRON HCL 4 MG/2ML IJ SOLN
4.0000 mg | Freq: Four times a day (QID) | INTRAMUSCULAR | Status: DC | PRN
  Filled 2013-12-18: qty 2

## 2013-12-18 MED ORDER — DIPHENHYDRAMINE HCL 12.5 MG/5ML PO ELIX
12.5000 mg | ORAL_SOLUTION | Freq: Four times a day (QID) | ORAL | Status: DC | PRN
Start: 2013-12-18 — End: 2013-12-19
  Filled 2013-12-18: qty 5

## 2013-12-18 MED ORDER — SODIUM CHLORIDE 0.9 % IV SOLN: INTRAVENOUS | Status: AC

## 2013-12-18 MED ORDER — SODIUM CHLORIDE 0.9 % IJ SOLN: 3.0000 mL | Freq: Two times a day (BID) | INTRAMUSCULAR | Status: DC

## 2013-12-18 MED ORDER — HYDROMORPHONE HCL 1 MG/ML IJ SOLN
INTRAMUSCULAR | Status: AC | PRN
  Administered 2013-12-18 (×7): 0.5 mg via INTRAVENOUS

## 2013-12-18 MED ORDER — ONDANSETRON 8 MG/NS 50 ML IVPB
8.0000 mg | Freq: Once | INTRAVENOUS | Status: AC
  Administered 2013-12-18: 8 mg via INTRAVENOUS
  Filled 2013-12-18: qty 8

## 2013-12-18 MED ORDER — MIDAZOLAM HCL 2 MG/2ML IJ SOLN
INTRAMUSCULAR | Status: AC | PRN
  Administered 2013-12-18 (×10): 0.5 mg via INTRAVENOUS

## 2013-12-18 MED ORDER — HYDROMORPHONE 0.3 MG/ML IV SOLN
INTRAVENOUS | Status: DC
  Administered 2013-12-18: 1.8 mg via INTRAVENOUS
  Administered 2013-12-18: 2.1 mg via INTRAVENOUS
  Administered 2013-12-18 – 2013-12-19 (×2): via INTRAVENOUS
  Administered 2013-12-19: 3 mg via INTRAVENOUS
  Filled 2013-12-18: qty 25

## 2013-12-18 MED ORDER — ONDANSETRON HCL 4 MG/2ML IJ SOLN
4.0000 mg | Freq: Four times a day (QID) | INTRAMUSCULAR | Status: AC
  Administered 2013-12-18 – 2013-12-19 (×4): 4 mg via INTRAVENOUS
  Filled 2013-12-18 (×3): qty 2

## 2013-12-18 MED ORDER — DIPHENHYDRAMINE HCL 50 MG/ML IJ SOLN: 12.5000 mg | Freq: Four times a day (QID) | INTRAMUSCULAR | Status: DC | PRN

## 2013-12-18 NOTE — Procedures (Signed)
Interventional Radiology Procedure Note  Procedure: Uterine artery embolization for symptomatic fibroids.  Superior hypogastric nerve block.  Complications: No immediate Recommendations:  - Ok to shower tomorrow - Do not submerge for 7 days - Manage post-op discomfort - Bed-rest 4 hours with right leg straight - Observe for post-embolization syndrome - Anticipate discharge in am  Signed,  Dulcy Fanny. Earleen Newport, DO

## 2013-12-18 NOTE — H&P (Signed)
Chief Complaint: Symptomatic uterine fibroid  Referring Physician(s): Dr. Janyth Pupa  History of Present Illness: Sylvia Henderson is a 53 y.o. female with history of symptomatic uterine fibroid who presents today following recent IR consultation for elective bilateral uterine artery embolization with  superior hypogastric nerve block.  Past Medical History  Diagnosis Date  . No pertinent past medical history   . Fibroids   . Anemia     Past Surgical History  Procedure Laterality Date  . Cesarean section    . Bunionectomy    . I&d extremity  05/31/2011    Procedure: IRRIGATION AND DEBRIDEMENT EXTREMITY;  Surgeon: Linna Hoff, MD;  Location: [facility];  Service: Orthopedics;  Laterality: Right;  Irrigation and Debridement Right index finger    Allergies: Review of patient's allergies indicates no known allergies.  Medications: Prior to Admission medications   Medication Sig Start Date End Date Taking? Authorizing Provider  ferrous sulfate 325 (65 FE) MG tablet Take 325 mg by mouth daily with breakfast.   Yes Historical Provider, MD  ibuprofen (ADVIL,MOTRIN) 600 MG tablet Take 600 mg by mouth every 6 (six) hours as needed.   Yes Historical Provider, MD  MAGNESIUM PO Take 1 tablet by mouth daily.   Yes Historical Provider, MD  Multiple Vitamins-Minerals (MULTIVITAMIN WITH MINERALS) tablet Take 1 tablet by mouth daily.   Yes Historical Provider, MD    History reviewed. No pertinent family history.  History   Social History  . Marital Status: Married    Spouse Name: N/A    Number of Children: N/A  . Years of Education: N/A   Social History Main Topics  . Smoking status: Never Smoker   . Smokeless tobacco: Never Used  . Alcohol Use: 3.0 oz/week    5 Glasses of wine per week  . Drug Use: No  . Sexual Activity: None   Other Topics Concern  . None   Social History Narrative         Review of Systems  Constitutional: Negative for  fever and chills.  Respiratory: Negative for cough and shortness of breath.   Cardiovascular: Negative for chest pain.  Gastrointestinal: Positive for abdominal pain. Negative for nausea, vomiting and blood in stool.  Genitourinary: Negative for dysuria and hematuria.       Pelvic cramping, menorrhagia, urinary frequency  Musculoskeletal: Positive for back pain.  Neurological: Negative for headaches.  Hematological: Does not bruise/bleed easily.    Vital Signs: BP 129/72 mmHg  Pulse 65  Temp(Src) 98.8 F (37.1 C) (Oral)  Resp 16  SpO2 100%  LMP 12/18/2013  Physical Exam  Constitutional: She is oriented to person, place, and time. She appears well-developed and well-nourished.  Cardiovascular: Normal rate and regular rhythm.   Pulmonary/Chest: Effort normal.  Few fine bibasilar crackles  Abdominal: Soft. Bowel sounds are normal.  Fibroid uterus  Musculoskeletal: Normal range of motion. She exhibits no edema.  Neurological: She is alert and oriented to person, place, and time.    Imaging: No results found.  Labs:  CBC:  Recent Labs  12/18/13 0750  WBC 6.5  HGB 10.6*  HCT 32.5*  PLT 341    COAGS:  Recent Labs  12/18/13 0750  INR 1.11  APTT 27    BMP: No results for input(s): NA, K, CL, CO2, GLUCOSE, BUN, CALCIUM, CREATININE, GFRNONAA, GFRAA in the last 8760 hours.  Invalid input(s): CMP  LIVER FUNCTION TESTS: No results for input(s): BILITOT, AST, ALT, ALKPHOS,  PROT, ALBUMIN in the last 8760 hours.  TUMOR MARKERS: No results for input(s): AFPTM, CEA, CA199, CHROMGRNA in the last 8760 hours.  Assessment and Plan: Sylvia Henderson is a 53 y.o. female with history of symptomatic uterine fibroid who presents today following recent IR consultation for elective bilateral uterine artery embolization with superior hypogastric nerve block. Details/risks of procedure d/w pt/husband with their understanding and consent. Following procedure pt will be admitted  for overnight observation for pain control/hemodynamic monitoring.       Signed: Autumn Messing 12/18/2013, 8:25 AM

## 2013-12-18 NOTE — Sedation Documentation (Signed)
Gauze/tegaderm bandage applied to R fem art puncture, CDI, 3+RDP.

## 2013-12-18 NOTE — Progress Notes (Signed)
Telephone order from MD [facility] that foley discontinued, order entered Neta Mends, [place] 12-18-2013 19:20pm

## 2013-12-18 NOTE — Progress Notes (Signed)
Day of Surgery  Subjective: Pt with mild pelvic cramping, some nausea earlier but now resolved  Objective: Vital signs in last 24 hours: Temp:  [97.5 F (36.4 C)-98.8 F (37.1 C)] 97.5 F (36.4 C) (12/18 1425) Pulse Rate:  [51-65] 52 (12/18 1425) Resp:  [8-41] 16 (12/18 1624) BP: (102-146)/(49-74) 116/59 mmHg (12/18 1425) SpO2:  [94 %-100 %] 100 % (12/18 1624) Weight:  [121 lb (54.885 kg)] 121 lb (54.885 kg) (12/18 1632) Last BM Date: 12/17/13  Intake/Output from previous day:   Intake/Output this shift: Total I/O In: 150 [IV Piggyback:150] Out: 500 [Urine:500]  Puncture sites rt CFA/ant lower pelvis clean and dry,NT, no hematoma; intact distal pulses; foley with yellow urine  Lab Results:   Recent Labs  12/18/13 0750  WBC 6.5  HGB 10.6*  HCT 32.5*  PLT 341   BMET  Recent Labs  12/18/13 0750  NA 139  K 4.0  CL 104  CO2 23  GLUCOSE 92  BUN 16  CREATININE 0.93  CALCIUM 9.2   PT/INR  Recent Labs  12/18/13 0750  LABPROT 14.4  INR 1.11   ABG No results for input(s): PHART, HCO3 in the last 72 hours.  Invalid input(s): PCO2, PO2  Studies/Results: Ir Angiogram Pelvis Selective Or Supraselective  12/18/2013   CLINICAL DATA:  53 year old female with symptomatic uterine fibroids. The patient is experiencing 2 of the 3 classes of symptoms, including menometrorrhagia, and bulk type symptoms.  The patient presents today for bilateral uterine artery embolization.  EXAM: UTERINE FIBROID EMBOLIZATION  ULTRASOUND GUIDED RIGHT COMMON FEMORAL ARTERY ACCESS.  SUPERIOR HYPOGASTRIC NERVE BLOCK  BILATERAL UTERINE ARTERY ANGIOGRAM/PELVIC ARTERY ANGIOGRAM  CLOSURE DEVICE DEPLOYMENT FOR RIGHT COMMON FEMORAL ARTERY HEMOSTASIS  ANESTHESIA/SEDATION: 5.0 Mg IV Versed; 3.5 MG mg Dilaudid  Total Moderate Sedation Time: 117  Minutes.  Contrast Volume:  ml  Additional Medications: 2.0 g Ancef, 30 mg IV Toradol, 8.0 mg Zofran, 200 mcg nitroglycerin  FLUOROSCOPY TIME:  18 min, 48 second   PROCEDURE: The procedure, risks, benefits, and alternatives were explained to the patient. Questions regarding the procedure were encouraged and answered. The patient understands and consents to the procedure.  The right groin and periumbilical region were prepped with Betadine in a sterile fashion, and a sterile drape was applied covering the operative field. A sterile gown and sterile gloves were used for the procedure. Local anesthesia was provided with 1% Lidocaine.  Ultrasound survey of the right inguinal region was performed with images stored and sent to PACs. Once the patient was anesthetized generously with 1% lidocaine in the right inguinal region, ultrasound guidance was used to access the right common femoral artery with a micropuncture needle. With excellent arterial blood flow returned, an 018 wire was advanced under fluoroscopy, observed enter the abdominal aorta. The needle was removed from the wire, and a micropuncture sheath was advanced over the wire. The inner dilator and wire were removed, and a 0.035 Bentson wire was advanced into the abdominal aorta. The micro sheath was removed and a standard 5 [place] vascular sheath was placed into the common femoral artery. The dilator was removed and the catheter was flushed.  A C2 Cobra catheter was advanced over the Bentson wire and used to select the left common iliac artery. The catheter was advanced to the distal common iliac artery.  Fluoroscopic guidance was then used to perform an anterior approach superior hypogastric block. A 20 cm, 22 gauge Chiba needle was advanced to the L5 level, just be low the  C2 Cobra catheter in the arterial tree. The needle was advanced with the image intensifier in of about 12 degrees of caudal angulation. 1% lidocaine was used to anesthetize the skin and subcutaneous tissues to the peritoneal level.  Once the [facility] catheter was felt to be on the anterior surface of L5, oblique images confirmed location of the needle.  The stylet was removed and a small amount of contrast confirmed the needle tip in the paravertebral space. Approximately 15 cc of bupivacaine and dilute contrast was infused into the space under fluoroscopy. The needle was then removed.  The C2 Cobra catheter was then navigated with a Bentson wire into the internal iliac artery. Hand injection was used for road mapping of the internal iliac artery. The anterior division was identified, with a traditional downgoing branch of the urine artery.  Once identifying the left uterine artery, a high-flow Renegade catheter and Fathom wire were navigated to the distal vertical segment of the uterine artery. Angiogram was performed.  We then embolized the left uterine artery using 500 - 700 Embospheres. Angiogram performed demonstrated 5 beat stasis at the completion of our embolization.  The C2 catheter was withdrawn into the common iliac artery and a Bentson wire was advanced to bring the tip of the C2 catheter into the distal external iliac artery. The Bentson wire was then reversed, with a stiff and used to form a Waltman loop.  The Waltman loop was then navigated first to the right internal iliac artery, with subsequent hand injection angiogram demonstrating a right-sided traditional arrangement of the urine artery. The Waltman loop was then engaged into the anterior division and right uterine artery. Angiogram of the right uterine artery was performed through the C2 catheter.  The renegade and Fathom combination were used to navigate to the horizontal segment of the right uterine artery. At the very proximal aspect of the horizontal segment of the right uterine artery, there was significant tortuosity. As the wire and catheter combination and gauge the proximal horizontal segment, vasospasm was induced. The vasospasm was treated with a total of 200 mcg of nitroglycerin.  With resolution of vasospasm, the catheter was stable at the proximal horizontal segment and we  commenced embolization with 500 -700 embospheres.  Completion angiogram demonstrates 5 beat stasis on the right, within the early divisions of the right urine artery. Because there were a few branches with stagnant column of contrast, embolization was terminated at this point.  Plain film of the pelvis demonstrated circumferential staining of the uterus with contrast.  The Waltman loop was on looped over the bifurcation of the aorta and withdrawn. Angiogram of the right common femoral artery was performed. The right common femoral artery was closed with an Exoseal closure device.  The patient tolerated the procedure well and remained hemodynamically stable throughout.  No complications were encountered and no significant blood loss was encountered.  COMPLICATIONS: None  FINDINGS: Bilateral uterine arteriography shows multiple enlarged hypervascular trunks supplying uterine fibroids.  Left uterine artery embolization was performed utilizing 1.5 vials of 500-700 micron sized particles. Completion arteriography demonstrates 5 beat stasis within the branches supplying uterine fibroids.  Right uterine artery embolization was performed utilizing 1.2 vials of 500 -700 Micron sized particles. Completion arteriography demonstrates 5 beat stasis of branches supplying uterine fibroids.  Adequate hemostasis was achieved at the femoral arteriotomy site after deployment of closure device.  Angiogram of the left uterine artery demonstrate early cervical vaginal branch at the proximal horizontal uterine artery. Navigating beyond this branch  induced some vasospasm which resolved after nitroglycerin. Embolization was performed beyond this branch.  IMPRESSION: Status post microsphere embolization of bilateral uterine artery for symptomatic uterine fibroids.  Status post fluoroscopic guided superior hypogastric block.  Deployment of Exoseal device for right common femoral artery hemostasis.  Signed,  Dulcy Fanny. Earleen Newport DO  [facility]  [facility]  PLAN: The patient will be admitted for overnight observation. She will be observed for pain control as well as post embolization syndrome.  The patient will have her Foley catheter removed after 6 hr of bed rest or in the morning, whichever is her preference.  Currently the patient is receiving Q 6 hour Toradol and PCA pump.  Signed,  Dulcy Fanny. Earleen Newport, DO  [facility]  [facility]   Electronically Signed   By: Corrie Mckusick D.O.   On: 12/18/2013 11:42   Ir Angiogram Pelvis Selective Or Supraselective  12/18/2013   CLINICAL DATA:  53 year old female with symptomatic uterine fibroids. The patient is experiencing 2 of the 3 classes of symptoms, including menometrorrhagia, and bulk type symptoms.  The patient presents today for bilateral uterine artery embolization.  EXAM: UTERINE FIBROID EMBOLIZATION  ULTRASOUND GUIDED RIGHT COMMON FEMORAL ARTERY ACCESS.  SUPERIOR HYPOGASTRIC NERVE BLOCK  BILATERAL UTERINE ARTERY ANGIOGRAM/PELVIC ARTERY ANGIOGRAM  CLOSURE DEVICE DEPLOYMENT FOR RIGHT COMMON FEMORAL ARTERY HEMOSTASIS  ANESTHESIA/SEDATION: 5.0 Mg IV Versed; 3.5 MG mg Dilaudid  Total Moderate Sedation Time: 117  Minutes.  Contrast Volume:  ml  Additional Medications: 2.0 g Ancef, 30 mg IV Toradol, 8.0 mg Zofran, 200 mcg nitroglycerin  FLUOROSCOPY TIME:  18 min, 48 second  PROCEDURE: The procedure, risks, benefits, and alternatives were explained to the patient. Questions regarding the procedure were encouraged and answered. The patient understands and consents to the procedure.  The right groin and periumbilical region were prepped with Betadine in a sterile fashion, and a sterile drape was applied covering the operative field. A sterile gown and sterile gloves were used for the procedure. Local anesthesia was provided with 1% Lidocaine.  Ultrasound survey of the right inguinal region was performed with images  stored and sent to PACs. Once the patient was anesthetized generously with 1% lidocaine in the right inguinal region, ultrasound guidance was used to access the right common femoral artery with a micropuncture needle. With excellent arterial blood flow returned, an 018 wire was advanced under fluoroscopy, observed enter the abdominal aorta. The needle was removed from the wire, and a micropuncture sheath was advanced over the wire. The inner dilator and wire were removed, and a 0.035 Bentson wire was advanced into the abdominal aorta. The micro sheath was removed and a standard 5 [place] vascular sheath was placed into the common femoral artery. The dilator was removed and the catheter was flushed.  A C2 Cobra catheter was advanced over the Bentson wire and used to select the left common iliac artery. The catheter was advanced to the distal common iliac artery.  Fluoroscopic guidance was then used to perform an anterior approach superior hypogastric block. A 20 cm, 22 gauge Chiba needle was advanced to the L5 level, just be low the C2 Cobra catheter in the arterial tree. The needle was advanced with the image intensifier in of about 12 degrees of caudal angulation. 1% lidocaine was used to anesthetize the skin and subcutaneous tissues to the peritoneal level.  Once the [facility] catheter was felt to be on the anterior surface of L5, oblique images confirmed  location of the needle. The stylet was removed and a small amount of contrast confirmed the needle tip in the paravertebral space. Approximately 15 cc of bupivacaine and dilute contrast was infused into the space under fluoroscopy. The needle was then removed.  The C2 Cobra catheter was then navigated with a Bentson wire into the internal iliac artery. Hand injection was used for road mapping of the internal iliac artery. The anterior division was identified, with a traditional downgoing branch of the urine artery.  Once identifying the left uterine artery, a  high-flow Renegade catheter and Fathom wire were navigated to the distal vertical segment of the uterine artery. Angiogram was performed.  We then embolized the left uterine artery using 500 - 700 Embospheres. Angiogram performed demonstrated 5 beat stasis at the completion of our embolization.  The C2 catheter was withdrawn into the common iliac artery and a Bentson wire was advanced to bring the tip of the C2 catheter into the distal external iliac artery. The Bentson wire was then reversed, with a stiff and used to form a Waltman loop.  The Waltman loop was then navigated first to the right internal iliac artery, with subsequent hand injection angiogram demonstrating a right-sided traditional arrangement of the urine artery. The Waltman loop was then engaged into the anterior division and right uterine artery. Angiogram of the right uterine artery was performed through the C2 catheter.  The renegade and Fathom combination were used to navigate to the horizontal segment of the right uterine artery. At the very proximal aspect of the horizontal segment of the right uterine artery, there was significant tortuosity. As the wire and catheter combination and gauge the proximal horizontal segment, vasospasm was induced. The vasospasm was treated with a total of 200 mcg of nitroglycerin.  With resolution of vasospasm, the catheter was stable at the proximal horizontal segment and we commenced embolization with 500 -700 embospheres.  Completion angiogram demonstrates 5 beat stasis on the right, within the early divisions of the right urine artery. Because there were a few branches with stagnant column of contrast, embolization was terminated at this point.  Plain film of the pelvis demonstrated circumferential staining of the uterus with contrast.  The Waltman loop was on looped over the bifurcation of the aorta and withdrawn. Angiogram of the right common femoral artery was performed. The right common femoral artery was  closed with an Exoseal closure device.  The patient tolerated the procedure well and remained hemodynamically stable throughout.  No complications were encountered and no significant blood loss was encountered.  COMPLICATIONS: None  FINDINGS: Bilateral uterine arteriography shows multiple enlarged hypervascular trunks supplying uterine fibroids.  Left uterine artery embolization was performed utilizing 1.5 vials of 500-700 micron sized particles. Completion arteriography demonstrates 5 beat stasis within the branches supplying uterine fibroids.  Right uterine artery embolization was performed utilizing 1.2 vials of 500 -700 Micron sized particles. Completion arteriography demonstrates 5 beat stasis of branches supplying uterine fibroids.  Adequate hemostasis was achieved at the femoral arteriotomy site after deployment of closure device.  Angiogram of the left uterine artery demonstrate early cervical vaginal branch at the proximal horizontal uterine artery. Navigating beyond this branch induced some vasospasm which resolved after nitroglycerin. Embolization was performed beyond this branch.  IMPRESSION: Status post microsphere embolization of bilateral uterine artery for symptomatic uterine fibroids.  Status post fluoroscopic guided superior hypogastric block.  Deployment of Exoseal device for right common femoral artery hemostasis.  Signed,  Dulcy Fanny. Earleen Newport, DO  [facility]  [facility]  PLAN: The patient will be admitted for overnight observation. She will be observed for pain control as well as post embolization syndrome.  The patient will have her Foley catheter removed after 6 hr of bed rest or in the morning, whichever is her preference.  Currently the patient is receiving Q 6 hour Toradol and PCA pump.  Signed,  Dulcy Fanny. Earleen Newport, DO  [facility]  [facility]   Electronically Signed   By: Corrie Mckusick D.O.   On:  12/18/2013 11:42   Ir Angiogram Selective Each Additional Vessel  12/18/2013   CLINICAL DATA:  53 year old female with symptomatic uterine fibroids. The patient is experiencing 2 of the 3 classes of symptoms, including menometrorrhagia, and bulk type symptoms.  The patient presents today for bilateral uterine artery embolization.  EXAM: UTERINE FIBROID EMBOLIZATION  ULTRASOUND GUIDED RIGHT COMMON FEMORAL ARTERY ACCESS.  SUPERIOR HYPOGASTRIC NERVE BLOCK  BILATERAL UTERINE ARTERY ANGIOGRAM/PELVIC ARTERY ANGIOGRAM  CLOSURE DEVICE DEPLOYMENT FOR RIGHT COMMON FEMORAL ARTERY HEMOSTASIS  ANESTHESIA/SEDATION: 5.0 Mg IV Versed; 3.5 MG mg Dilaudid  Total Moderate Sedation Time: 117  Minutes.  Contrast Volume:  ml  Additional Medications: 2.0 g Ancef, 30 mg IV Toradol, 8.0 mg Zofran, 200 mcg nitroglycerin  FLUOROSCOPY TIME:  18 min, 48 second  PROCEDURE: The procedure, risks, benefits, and alternatives were explained to the patient. Questions regarding the procedure were encouraged and answered. The patient understands and consents to the procedure.  The right groin and periumbilical region were prepped with Betadine in a sterile fashion, and a sterile drape was applied covering the operative field. A sterile gown and sterile gloves were used for the procedure. Local anesthesia was provided with 1% Lidocaine.  Ultrasound survey of the right inguinal region was performed with images stored and sent to PACs. Once the patient was anesthetized generously with 1% lidocaine in the right inguinal region, ultrasound guidance was used to access the right common femoral artery with a micropuncture needle. With excellent arterial blood flow returned, an 018 wire was advanced under fluoroscopy, observed enter the abdominal aorta. The needle was removed from the wire, and a micropuncture sheath was advanced over the wire. The inner dilator and wire were removed, and a 0.035 Bentson wire was advanced into the abdominal aorta. The micro  sheath was removed and a standard 5 [place] vascular sheath was placed into the common femoral artery. The dilator was removed and the catheter was flushed.  A C2 Cobra catheter was advanced over the Bentson wire and used to select the left common iliac artery. The catheter was advanced to the distal common iliac artery.  Fluoroscopic guidance was then used to perform an anterior approach superior hypogastric block. A 20 cm, 22 gauge Chiba needle was advanced to the L5 level, just be low the C2 Cobra catheter in the arterial tree. The needle was advanced with the image intensifier in of about 12 degrees of caudal angulation. 1% lidocaine was used to anesthetize the skin and subcutaneous tissues to the peritoneal level.  Once the [facility] catheter was felt to be on the anterior surface of L5, oblique images confirmed location of the needle. The stylet was removed and a small amount of contrast confirmed the needle tip in the paravertebral space. Approximately 15 cc of bupivacaine and dilute contrast was infused into the space under fluoroscopy. The needle was then removed.  The C2 Cobra catheter was then navigated with a Bentson wire into the  internal iliac artery. Hand injection was used for road mapping of the internal iliac artery. The anterior division was identified, with a traditional downgoing branch of the urine artery.  Once identifying the left uterine artery, a high-flow Renegade catheter and Fathom wire were navigated to the distal vertical segment of the uterine artery. Angiogram was performed.  We then embolized the left uterine artery using 500 - 700 Embospheres. Angiogram performed demonstrated 5 beat stasis at the completion of our embolization.  The C2 catheter was withdrawn into the common iliac artery and a Bentson wire was advanced to bring the tip of the C2 catheter into the distal external iliac artery. The Bentson wire was then reversed, with a stiff and used to form a Waltman loop.  The Waltman  loop was then navigated first to the right internal iliac artery, with subsequent hand injection angiogram demonstrating a right-sided traditional arrangement of the urine artery. The Waltman loop was then engaged into the anterior division and right uterine artery. Angiogram of the right uterine artery was performed through the C2 catheter.  The renegade and Fathom combination were used to navigate to the horizontal segment of the right uterine artery. At the very proximal aspect of the horizontal segment of the right uterine artery, there was significant tortuosity. As the wire and catheter combination and gauge the proximal horizontal segment, vasospasm was induced. The vasospasm was treated with a total of 200 mcg of nitroglycerin.  With resolution of vasospasm, the catheter was stable at the proximal horizontal segment and we commenced embolization with 500 -700 embospheres.  Completion angiogram demonstrates 5 beat stasis on the right, within the early divisions of the right urine artery. Because there were a few branches with stagnant column of contrast, embolization was terminated at this point.  Plain film of the pelvis demonstrated circumferential staining of the uterus with contrast.  The Waltman loop was on looped over the bifurcation of the aorta and withdrawn. Angiogram of the right common femoral artery was performed. The right common femoral artery was closed with an Exoseal closure device.  The patient tolerated the procedure well and remained hemodynamically stable throughout.  No complications were encountered and no significant blood loss was encountered.  COMPLICATIONS: None  FINDINGS: Bilateral uterine arteriography shows multiple enlarged hypervascular trunks supplying uterine fibroids.  Left uterine artery embolization was performed utilizing 1.5 vials of 500-700 micron sized particles. Completion arteriography demonstrates 5 beat stasis within the branches supplying uterine fibroids.  Right  uterine artery embolization was performed utilizing 1.2 vials of 500 -700 Micron sized particles. Completion arteriography demonstrates 5 beat stasis of branches supplying uterine fibroids.  Adequate hemostasis was achieved at the femoral arteriotomy site after deployment of closure device.  Angiogram of the left uterine artery demonstrate early cervical vaginal branch at the proximal horizontal uterine artery. Navigating beyond this branch induced some vasospasm which resolved after nitroglycerin. Embolization was performed beyond this branch.  IMPRESSION: Status post microsphere embolization of bilateral uterine artery for symptomatic uterine fibroids.  Status post fluoroscopic guided superior hypogastric block.  Deployment of Exoseal device for right common femoral artery hemostasis.  Signed,  Dulcy Fanny. Earleen Newport DO  [facility]  [facility] Radiology  PLAN: The patient will be admitted for overnight observation. She will be observed for pain control as well as post embolization syndrome.  The patient will have her Foley catheter removed after 6 hr of bed rest or in the morning, whichever is her preference.  Currently the patient  is receiving Q 6 hour Toradol and PCA pump.  Signed,  Dulcy Fanny. Earleen Newport, DO  [facility]  [facility] Radiology   Electronically Signed   By: Corrie Mckusick D.O.   On: 12/18/2013 11:42   Ir Angiogram Selective Each Additional Vessel  12/18/2013   CLINICAL DATA:  53 year old female with symptomatic uterine fibroids. The patient is experiencing 2 of the 3 classes of symptoms, including menometrorrhagia, and bulk type symptoms.  The patient presents today for bilateral uterine artery embolization.  EXAM: UTERINE FIBROID EMBOLIZATION  ULTRASOUND GUIDED RIGHT COMMON FEMORAL ARTERY ACCESS.  SUPERIOR HYPOGASTRIC NERVE BLOCK  BILATERAL UTERINE ARTERY ANGIOGRAM/PELVIC ARTERY ANGIOGRAM  CLOSURE DEVICE DEPLOYMENT FOR RIGHT  COMMON FEMORAL ARTERY HEMOSTASIS  ANESTHESIA/SEDATION: 5.0 Mg IV Versed; 3.5 MG mg Dilaudid  Total Moderate Sedation Time: 117  Minutes.  Contrast Volume:  ml  Additional Medications: 2.0 g Ancef, 30 mg IV Toradol, 8.0 mg Zofran, 200 mcg nitroglycerin  FLUOROSCOPY TIME:  18 min, 48 second  PROCEDURE: The procedure, risks, benefits, and alternatives were explained to the patient. Questions regarding the procedure were encouraged and answered. The patient understands and consents to the procedure.  The right groin and periumbilical region were prepped with Betadine in a sterile fashion, and a sterile drape was applied covering the operative field. A sterile gown and sterile gloves were used for the procedure. Local anesthesia was provided with 1% Lidocaine.  Ultrasound survey of the right inguinal region was performed with images stored and sent to PACs. Once the patient was anesthetized generously with 1% lidocaine in the right inguinal region, ultrasound guidance was used to access the right common femoral artery with a micropuncture needle. With excellent arterial blood flow returned, an 018 wire was advanced under fluoroscopy, observed enter the abdominal aorta. The needle was removed from the wire, and a micropuncture sheath was advanced over the wire. The inner dilator and wire were removed, and a 0.035 Bentson wire was advanced into the abdominal aorta. The micro sheath was removed and a standard 5 [place] vascular sheath was placed into the common femoral artery. The dilator was removed and the catheter was flushed.  A C2 Cobra catheter was advanced over the Bentson wire and used to select the left common iliac artery. The catheter was advanced to the distal common iliac artery.  Fluoroscopic guidance was then used to perform an anterior approach superior hypogastric block. A 20 cm, 22 gauge Chiba needle was advanced to the L5 level, just be low the C2 Cobra catheter in the arterial tree. The needle was  advanced with the image intensifier in of about 12 degrees of caudal angulation. 1% lidocaine was used to anesthetize the skin and subcutaneous tissues to the peritoneal level.  Once the [facility] catheter was felt to be on the anterior surface of L5, oblique images confirmed location of the needle. The stylet was removed and a small amount of contrast confirmed the needle tip in the paravertebral space. Approximately 15 cc of bupivacaine and dilute contrast was infused into the space under fluoroscopy. The needle was then removed.  The C2 Cobra catheter was then navigated with a Bentson wire into the internal iliac artery. Hand injection was used for road mapping of the internal iliac artery. The anterior division was identified, with a traditional downgoing branch of the urine artery.  Once identifying the left uterine artery, a high-flow Renegade catheter and Fathom wire were navigated to the distal vertical segment of the uterine artery. Angiogram was  performed.  We then embolized the left uterine artery using 500 - 700 Embospheres. Angiogram performed demonstrated 5 beat stasis at the completion of our embolization.  The C2 catheter was withdrawn into the common iliac artery and a Bentson wire was advanced to bring the tip of the C2 catheter into the distal external iliac artery. The Bentson wire was then reversed, with a stiff and used to form a Waltman loop.  The Waltman loop was then navigated first to the right internal iliac artery, with subsequent hand injection angiogram demonstrating a right-sided traditional arrangement of the urine artery. The Waltman loop was then engaged into the anterior division and right uterine artery. Angiogram of the right uterine artery was performed through the C2 catheter.  The renegade and Fathom combination were used to navigate to the horizontal segment of the right uterine artery. At the very proximal aspect of the horizontal segment of the right uterine artery, there was  significant tortuosity. As the wire and catheter combination and gauge the proximal horizontal segment, vasospasm was induced. The vasospasm was treated with a total of 200 mcg of nitroglycerin.  With resolution of vasospasm, the catheter was stable at the proximal horizontal segment and we commenced embolization with 500 -700 embospheres.  Completion angiogram demonstrates 5 beat stasis on the right, within the early divisions of the right urine artery. Because there were a few branches with stagnant column of contrast, embolization was terminated at this point.  Plain film of the pelvis demonstrated circumferential staining of the uterus with contrast.  The Waltman loop was on looped over the bifurcation of the aorta and withdrawn. Angiogram of the right common femoral artery was performed. The right common femoral artery was closed with an Exoseal closure device.  The patient tolerated the procedure well and remained hemodynamically stable throughout.  No complications were encountered and no significant blood loss was encountered.  COMPLICATIONS: None  FINDINGS: Bilateral uterine arteriography shows multiple enlarged hypervascular trunks supplying uterine fibroids.  Left uterine artery embolization was performed utilizing 1.5 vials of 500-700 micron sized particles. Completion arteriography demonstrates 5 beat stasis within the branches supplying uterine fibroids.  Right uterine artery embolization was performed utilizing 1.2 vials of 500 -700 Micron sized particles. Completion arteriography demonstrates 5 beat stasis of branches supplying uterine fibroids.  Adequate hemostasis was achieved at the femoral arteriotomy site after deployment of closure device.  Angiogram of the left uterine artery demonstrate early cervical vaginal branch at the proximal horizontal uterine artery. Navigating beyond this branch induced some vasospasm which resolved after nitroglycerin. Embolization was performed beyond this branch.   IMPRESSION: Status post microsphere embolization of bilateral uterine artery for symptomatic uterine fibroids.  Status post fluoroscopic guided superior hypogastric block.  Deployment of Exoseal device for right common femoral artery hemostasis.  Signed,  Dulcy Fanny. Earleen Newport DO  [facility]  [facility]  PLAN: The patient will be admitted for overnight observation. She will be observed for pain control as well as post embolization syndrome.  The patient will have her Foley catheter removed after 6 hr of bed rest or in the morning, whichever is her preference.  Currently the patient is receiving Q 6 hour Toradol and PCA pump.  Signed,  Dulcy Fanny. Earleen Newport, DO  [facility]  [facility]   Electronically Signed   By: Corrie Mckusick D.O.   On: 12/18/2013 11:42   Ir US Guide Vasc Access Right  12/18/2013   CLINICAL DATA:  53 year old female with symptomatic uterine fibroids. The patient is experiencing 2 of the 3 classes of symptoms, including menometrorrhagia, and bulk type symptoms.  The patient presents today for bilateral uterine artery embolization.  EXAM: UTERINE FIBROID EMBOLIZATION  ULTRASOUND GUIDED RIGHT COMMON FEMORAL ARTERY ACCESS.  SUPERIOR HYPOGASTRIC NERVE BLOCK  BILATERAL UTERINE ARTERY ANGIOGRAM/PELVIC ARTERY ANGIOGRAM  CLOSURE DEVICE DEPLOYMENT FOR RIGHT COMMON FEMORAL ARTERY HEMOSTASIS  ANESTHESIA/SEDATION: 5.0 Mg IV Versed; 3.5 MG mg Dilaudid  Total Moderate Sedation Time: 117  Minutes.  Contrast Volume:  ml  Additional Medications: 2.0 g Ancef, 30 mg IV Toradol, 8.0 mg Zofran, 200 mcg nitroglycerin  FLUOROSCOPY TIME:  18 min, 48 second  PROCEDURE: The procedure, risks, benefits, and alternatives were explained to the patient. Questions regarding the procedure were encouraged and answered. The patient understands and consents to the procedure.  The right groin and periumbilical region were prepped with Betadine in  a sterile fashion, and a sterile drape was applied covering the operative field. A sterile gown and sterile gloves were used for the procedure. Local anesthesia was provided with 1% Lidocaine.  Ultrasound survey of the right inguinal region was performed with images stored and sent to PACs. Once the patient was anesthetized generously with 1% lidocaine in the right inguinal region, ultrasound guidance was used to access the right common femoral artery with a micropuncture needle. With excellent arterial blood flow returned, an 018 wire was advanced under fluoroscopy, observed enter the abdominal aorta. The needle was removed from the wire, and a micropuncture sheath was advanced over the wire. The inner dilator and wire were removed, and a 0.035 Bentson wire was advanced into the abdominal aorta. The micro sheath was removed and a standard 5 [place] vascular sheath was placed into the common femoral artery. The dilator was removed and the catheter was flushed.  A C2 Cobra catheter was advanced over the Bentson wire and used to select the left common iliac artery. The catheter was advanced to the distal common iliac artery.  Fluoroscopic guidance was then used to perform an anterior approach superior hypogastric block. A 20 cm, 22 gauge Chiba needle was advanced to the L5 level, just be low the C2 Cobra catheter in the arterial tree. The needle was advanced with the image intensifier in of about 12 degrees of caudal angulation. 1% lidocaine was used to anesthetize the skin and subcutaneous tissues to the peritoneal level.  Once the [facility] catheter was felt to be on the anterior surface of L5, oblique images confirmed location of the needle. The stylet was removed and a small amount of contrast confirmed the needle tip in the paravertebral space. Approximately 15 cc of bupivacaine and dilute contrast was infused into the space under fluoroscopy. The needle was then removed.  The C2 Cobra catheter was then navigated with  a Bentson wire into the internal iliac artery. Hand injection was used for road mapping of the internal iliac artery. The anterior division was identified, with a traditional downgoing branch of the urine artery.  Once identifying the left uterine artery, a high-flow Renegade catheter and Fathom wire were navigated to the distal vertical segment of the uterine artery. Angiogram was performed.  We then embolized the left uterine artery using 500 - 700 Embospheres. Angiogram performed demonstrated 5 beat stasis at the completion of our embolization.  The C2 catheter was withdrawn into the common iliac artery and a Bentson wire was advanced to bring the tip of the C2 catheter into the distal external iliac  artery. The Bentson wire was then reversed, with a stiff and used to form a Waltman loop.  The Waltman loop was then navigated first to the right internal iliac artery, with subsequent hand injection angiogram demonstrating a right-sided traditional arrangement of the urine artery. The Waltman loop was then engaged into the anterior division and right uterine artery. Angiogram of the right uterine artery was performed through the C2 catheter.  The renegade and Fathom combination were used to navigate to the horizontal segment of the right uterine artery. At the very proximal aspect of the horizontal segment of the right uterine artery, there was significant tortuosity. As the wire and catheter combination and gauge the proximal horizontal segment, vasospasm was induced. The vasospasm was treated with a total of 200 mcg of nitroglycerin.  With resolution of vasospasm, the catheter was stable at the proximal horizontal segment and we commenced embolization with 500 -700 embospheres.  Completion angiogram demonstrates 5 beat stasis on the right, within the early divisions of the right urine artery. Because there were a few branches with stagnant column of contrast, embolization was terminated at this point.  Plain film  of the pelvis demonstrated circumferential staining of the uterus with contrast.  The Waltman loop was on looped over the bifurcation of the aorta and withdrawn. Angiogram of the right common femoral artery was performed. The right common femoral artery was closed with an Exoseal closure device.  The patient tolerated the procedure well and remained hemodynamically stable throughout.  No complications were encountered and no significant blood loss was encountered.  COMPLICATIONS: None  FINDINGS: Bilateral uterine arteriography shows multiple enlarged hypervascular trunks supplying uterine fibroids.  Left uterine artery embolization was performed utilizing 1.5 vials of 500-700 micron sized particles. Completion arteriography demonstrates 5 beat stasis within the branches supplying uterine fibroids.  Right uterine artery embolization was performed utilizing 1.2 vials of 500 -700 Micron sized particles. Completion arteriography demonstrates 5 beat stasis of branches supplying uterine fibroids.  Adequate hemostasis was achieved at the femoral arteriotomy site after deployment of closure device.  Angiogram of the left uterine artery demonstrate early cervical vaginal branch at the proximal horizontal uterine artery. Navigating beyond this branch induced some vasospasm which resolved after nitroglycerin. Embolization was performed beyond this branch.  IMPRESSION: Status post microsphere embolization of bilateral uterine artery for symptomatic uterine fibroids.  Status post fluoroscopic guided superior hypogastric block.  Deployment of Exoseal device for right common femoral artery hemostasis.  Signed,  Dulcy Fanny. Earleen Newport DO  [facility]  [facility]  PLAN: The patient will be admitted for overnight observation. She will be observed for pain control as well as post embolization syndrome.  The patient will have her Foley catheter removed after 6 hr of bed rest or in the morning,  whichever is her preference.  Currently the patient is receiving Q 6 hour Toradol and PCA pump.  Signed,  Dulcy Fanny. Earleen Newport, DO  [facility]  [facility]   Electronically Signed   By: Corrie Mckusick D.O.   On: 12/18/2013 11:42   Ir Fluoro Guide Ndl Plmt / Bx  12/18/2013   CLINICAL DATA:  53 year old female with symptomatic uterine fibroids. The patient is experiencing 2 of the 3 classes of symptoms, including menometrorrhagia, and bulk type symptoms.  The patient presents today for bilateral uterine artery embolization.  EXAM: UTERINE FIBROID EMBOLIZATION  ULTRASOUND GUIDED RIGHT COMMON FEMORAL ARTERY ACCESS.  SUPERIOR HYPOGASTRIC NERVE BLOCK  BILATERAL UTERINE ARTERY  ANGIOGRAM/PELVIC ARTERY ANGIOGRAM  CLOSURE DEVICE DEPLOYMENT FOR RIGHT COMMON FEMORAL ARTERY HEMOSTASIS  ANESTHESIA/SEDATION: 5.0 Mg IV Versed; 3.5 MG mg Dilaudid  Total Moderate Sedation Time: 117  Minutes.  Contrast Volume:  ml  Additional Medications: 2.0 g Ancef, 30 mg IV Toradol, 8.0 mg Zofran, 200 mcg nitroglycerin  FLUOROSCOPY TIME:  18 min, 48 second  PROCEDURE: The procedure, risks, benefits, and alternatives were explained to the patient. Questions regarding the procedure were encouraged and answered. The patient understands and consents to the procedure.  The right groin and periumbilical region were prepped with Betadine in a sterile fashion, and a sterile drape was applied covering the operative field. A sterile gown and sterile gloves were used for the procedure. Local anesthesia was provided with 1% Lidocaine.  Ultrasound survey of the right inguinal region was performed with images stored and sent to PACs. Once the patient was anesthetized generously with 1% lidocaine in the right inguinal region, ultrasound guidance was used to access the right common femoral artery with a micropuncture needle. With excellent arterial blood flow returned, an 018 wire was advanced under fluoroscopy,  observed enter the abdominal aorta. The needle was removed from the wire, and a micropuncture sheath was advanced over the wire. The inner dilator and wire were removed, and a 0.035 Bentson wire was advanced into the abdominal aorta. The micro sheath was removed and a standard 5 [place] vascular sheath was placed into the common femoral artery. The dilator was removed and the catheter was flushed.  A C2 Cobra catheter was advanced over the Bentson wire and used to select the left common iliac artery. The catheter was advanced to the distal common iliac artery.  Fluoroscopic guidance was then used to perform an anterior approach superior hypogastric block. A 20 cm, 22 gauge Chiba needle was advanced to the L5 level, just be low the C2 Cobra catheter in the arterial tree. The needle was advanced with the image intensifier in of about 12 degrees of caudal angulation. 1% lidocaine was used to anesthetize the skin and subcutaneous tissues to the peritoneal level.  Once the [facility] catheter was felt to be on the anterior surface of L5, oblique images confirmed location of the needle. The stylet was removed and a small amount of contrast confirmed the needle tip in the paravertebral space. Approximately 15 cc of bupivacaine and dilute contrast was infused into the space under fluoroscopy. The needle was then removed.  The C2 Cobra catheter was then navigated with a Bentson wire into the internal iliac artery. Hand injection was used for road mapping of the internal iliac artery. The anterior division was identified, with a traditional downgoing branch of the urine artery.  Once identifying the left uterine artery, a high-flow Renegade catheter and Fathom wire were navigated to the distal vertical segment of the uterine artery. Angiogram was performed.  We then embolized the left uterine artery using 500 - 700 Embospheres. Angiogram performed demonstrated 5 beat stasis at the completion of our embolization.  The C2 catheter  was withdrawn into the common iliac artery and a Bentson wire was advanced to bring the tip of the C2 catheter into the distal external iliac artery. The Bentson wire was then reversed, with a stiff and used to form a Waltman loop.  The Waltman loop was then navigated first to the right internal iliac artery, with subsequent hand injection angiogram demonstrating a right-sided traditional arrangement of the urine artery. The Waltman loop was then engaged into the anterior division  and right uterine artery. Angiogram of the right uterine artery was performed through the C2 catheter.  The renegade and Fathom combination were used to navigate to the horizontal segment of the right uterine artery. At the very proximal aspect of the horizontal segment of the right uterine artery, there was significant tortuosity. As the wire and catheter combination and gauge the proximal horizontal segment, vasospasm was induced. The vasospasm was treated with a total of 200 mcg of nitroglycerin.  With resolution of vasospasm, the catheter was stable at the proximal horizontal segment and we commenced embolization with 500 -700 embospheres.  Completion angiogram demonstrates 5 beat stasis on the right, within the early divisions of the right urine artery. Because there were a few branches with stagnant column of contrast, embolization was terminated at this point.  Plain film of the pelvis demonstrated circumferential staining of the uterus with contrast.  The Waltman loop was on looped over the bifurcation of the aorta and withdrawn. Angiogram of the right common femoral artery was performed. The right common femoral artery was closed with an Exoseal closure device.  The patient tolerated the procedure well and remained hemodynamically stable throughout.  No complications were encountered and no significant blood loss was encountered.  COMPLICATIONS: None  FINDINGS: Bilateral uterine arteriography shows multiple enlarged hypervascular  trunks supplying uterine fibroids.  Left uterine artery embolization was performed utilizing 1.5 vials of 500-700 micron sized particles. Completion arteriography demonstrates 5 beat stasis within the branches supplying uterine fibroids.  Right uterine artery embolization was performed utilizing 1.2 vials of 500 -700 Micron sized particles. Completion arteriography demonstrates 5 beat stasis of branches supplying uterine fibroids.  Adequate hemostasis was achieved at the femoral arteriotomy site after deployment of closure device.  Angiogram of the left uterine artery demonstrate early cervical vaginal branch at the proximal horizontal uterine artery. Navigating beyond this branch induced some vasospasm which resolved after nitroglycerin. Embolization was performed beyond this branch.  IMPRESSION: Status post microsphere embolization of bilateral uterine artery for symptomatic uterine fibroids.  Status post fluoroscopic guided superior hypogastric block.  Deployment of Exoseal device for right common femoral artery hemostasis.  Signed,  Dulcy Fanny. Earleen Newport DO  [facility]  [facility] Radiology  PLAN: The patient will be admitted for overnight observation. She will be observed for pain control as well as post embolization syndrome.  The patient will have her Foley catheter removed after 6 hr of bed rest or in the morning, whichever is her preference.  Currently the patient is receiving Q 6 hour Toradol and PCA pump.  Signed,  Dulcy Fanny. Earleen Newport, DO  [facility]  [facility]iology   Electronically Signed   By: Corrie Mckusick D.O.   On: 12/18/2013 11:42   Ir Embo Arterial Not [place]  12/18/2013   CLINICAL DATA:  53 year old female with symptomatic uterine fibroids. The patient is experiencing 2 of the 3 classes of symptoms, including menometrorrhagia, and bulk type symptoms.  The patient presents today for bilateral  uterine artery embolization.  EXAM: UTERINE FIBROID EMBOLIZATION  ULTRASOUND GUIDED RIGHT COMMON FEMORAL ARTERY ACCESS.  SUPERIOR HYPOGASTRIC NERVE BLOCK  BILATERAL UTERINE ARTERY ANGIOGRAM/PELVIC ARTERY ANGIOGRAM  CLOSURE DEVICE DEPLOYMENT FOR RIGHT COMMON FEMORAL ARTERY HEMOSTASIS  ANESTHESIA/SEDATION: 5.0 Mg IV Versed; 3.5 MG mg Dilaudid  Total Moderate Sedation Time: 117  Minutes.  Contrast Volume:  ml  Additional Medications: 2.0 g Ancef, 30 mg IV Toradol, 8.0 mg Zofran, 200 mcg nitroglycerin  FLUOROSCOPY TIME:  18 min, 48 second  PROCEDURE: The procedure, risks, benefits, and alternatives were explained to the patient. Questions regarding the procedure were encouraged and answered. The patient understands and consents to the procedure.  The right groin and periumbilical region were prepped with Betadine in a sterile fashion, and a sterile drape was applied covering the operative field. A sterile gown and sterile gloves were used for the procedure. Local anesthesia was provided with 1% Lidocaine.  Ultrasound survey of the right inguinal region was performed with images stored and sent to PACs. Once the patient was anesthetized generously with 1% lidocaine in the right inguinal region, ultrasound guidance was used to access the right common femoral artery with a micropuncture needle. With excellent arterial blood flow returned, an 018 wire was advanced under fluoroscopy, observed enter the abdominal aorta. The needle was removed from the wire, and a micropuncture sheath was advanced over the wire. The inner dilator and wire were removed, and a 0.035 Bentson wire was advanced into the abdominal aorta. The micro sheath was removed and a standard 5 [place] vascular sheath was placed into the common femoral artery. The dilator was removed and the catheter was flushed.  A C2 Cobra catheter was advanced over the Bentson wire and used to select the left common iliac artery. The catheter was advanced to the distal common  iliac artery.  Fluoroscopic guidance was then used to perform an anterior approach superior hypogastric block. A 20 cm, 22 gauge Chiba needle was advanced to the L5 level, just be low the C2 Cobra catheter in the arterial tree. The needle was advanced with the image intensifier in of about 12 degrees of caudal angulation. 1% lidocaine was used to anesthetize the skin and subcutaneous tissues to the peritoneal level.  Once the [facility] catheter was felt to be on the anterior surface of L5, oblique images confirmed location of the needle. The stylet was removed and a small amount of contrast confirmed the needle tip in the paravertebral space. Approximately 15 cc of bupivacaine and dilute contrast was infused into the space under fluoroscopy. The needle was then removed.  The C2 Cobra catheter was then navigated with a Bentson wire into the internal iliac artery. Hand injection was used for road mapping of the internal iliac artery. The anterior division was identified, with a traditional downgoing branch of the urine artery.  Once identifying the left uterine artery, a high-flow Renegade catheter and Fathom wire were navigated to the distal vertical segment of the uterine artery. Angiogram was performed.  We then embolized the left uterine artery using 500 - 700 Embospheres. Angiogram performed demonstrated 5 beat stasis at the completion of our embolization.  The C2 catheter was withdrawn into the common iliac artery and a Bentson wire was advanced to bring the tip of the C2 catheter into the distal external iliac artery. The Bentson wire was then reversed, with a stiff and used to form a Waltman loop.  The Waltman loop was then navigated first to the right internal iliac artery, with subsequent hand injection angiogram demonstrating a right-sided traditional arrangement of the urine artery. The Waltman loop was then engaged into the anterior division and right uterine artery. Angiogram of the right uterine artery was  performed through the C2 catheter.  The renegade and Fathom combination were used to navigate to the horizontal segment of the right uterine artery. At the very proximal aspect of the horizontal segment of the right uterine artery, there was significant tortuosity.  As the wire and catheter combination and gauge the proximal horizontal segment, vasospasm was induced. The vasospasm was treated with a total of 200 mcg of nitroglycerin.  With resolution of vasospasm, the catheter was stable at the proximal horizontal segment and we commenced embolization with 500 -700 embospheres.  Completion angiogram demonstrates 5 beat stasis on the right, within the early divisions of the right urine artery. Because there were a few branches with stagnant column of contrast, embolization was terminated at this point.  Plain film of the pelvis demonstrated circumferential staining of the uterus with contrast.  The Waltman loop was on looped over the bifurcation of the aorta and withdrawn. Angiogram of the right common femoral artery was performed. The right common femoral artery was closed with an Exoseal closure device.  The patient tolerated the procedure well and remained hemodynamically stable throughout.  No complications were encountered and no significant blood loss was encountered.  COMPLICATIONS: None  FINDINGS: Bilateral uterine arteriography shows multiple enlarged hypervascular trunks supplying uterine fibroids.  Left uterine artery embolization was performed utilizing 1.5 vials of 500-700 micron sized particles. Completion arteriography demonstrates 5 beat stasis within the branches supplying uterine fibroids.  Right uterine artery embolization was performed utilizing 1.2 vials of 500 -700 Micron sized particles. Completion arteriography demonstrates 5 beat stasis of branches supplying uterine fibroids.  Adequate hemostasis was achieved at the femoral arteriotomy site after deployment of closure device.  Angiogram of the  left uterine artery demonstrate early cervical vaginal branch at the proximal horizontal uterine artery. Navigating beyond this branch induced some vasospasm which resolved after nitroglycerin. Embolization was performed beyond this branch.  IMPRESSION: Status post microsphere embolization of bilateral uterine artery for symptomatic uterine fibroids.  Status post fluoroscopic guided superior hypogastric block.  Deployment of Exoseal device for right common femoral artery hemostasis.  Signed,  Dulcy Fanny. Earleen Newport DO  [facility]  [facility]  PLAN: The patient will be admitted for overnight observation. She will be observed for pain control as well as post embolization syndrome.  The patient will have her Foley catheter removed after 6 hr of bed rest or in the morning, whichever is her preference.  Currently the patient is receiving Q 6 hour Toradol and PCA pump.  Signed,  Dulcy Fanny. Earleen Newport, DO  [facility]  [facility]   Electronically Signed   By: Corrie Mckusick D.O.   On: 12/18/2013 11:42    Anti-infectives: Anti-infectives    Start     Dose/Rate Route Frequency Ordered Stop   12/18/13 0730  ceFAZolin (ANCEF) IVPB 2 g/50 mL premix     2 g100 mL/hr over 30 Minutes Intravenous On call 12/18/13 0240 12/18/13 0933      Assessment/Plan: s/p Uterine artery embolization for symptomatic fibroids ,Superior hypogastric nerve block 12/18; for overnight obs; d/c foley cath later this evening; f/u with Dr. Earleen Newport in [place] [facility] in 2 weeks  LOS: 0 days    Adlynn Lowenstein,D [facility] 12/18/2013

## 2013-12-18 NOTE — Sedation Documentation (Signed)
5Fr sheath removed from R femoral artery by Dr. Earleen Newport.  Hemostasis achieved using Exoseal closure device.  R groin level 0, 3+RDP.

## 2013-12-19 ENCOUNTER — Other Ambulatory Visit: Payer: Self-pay | Admitting: Radiology

## 2013-12-19 DIAGNOSIS — N920 Excessive and frequent menstruation with regular cycle: Secondary | ICD-10-CM

## 2013-12-19 DIAGNOSIS — D251 Intramural leiomyoma of uterus: Secondary | ICD-10-CM | POA: Diagnosis not present

## 2013-12-19 DIAGNOSIS — D259 Leiomyoma of uterus, unspecified: Secondary | ICD-10-CM

## 2013-12-19 NOTE — Progress Notes (Signed)
Utilization Review completed.  

## 2013-12-19 NOTE — Discharge Instructions (Signed)
Uterine Artery Embolization for Fibroids, Care After Refer to this sheet in the next few weeks. These instructions provide you with information on caring for yourself after your procedure. Your health care provider may also give you more specific instructions. Your treatment has been planned according to current medical practices, but problems sometimes occur. Call your health care provider if you have any problems or questions after your procedure. WHAT TO EXPECT AFTER THE PROCEDURE After your procedure, it is typical to have cramping in the pelvis. You will be given pain medicine to control it. HOME CARE INSTRUCTIONS  Only take over-the-counter or prescription medicines for pain, discomfort, or fever as directed by your health care provider.  Do not take aspirin. It can cause bleeding.  Follow your health care provider's advice regarding medicines given to you, diet, activity, and when to begin sexual activity.  See your health care provider for follow-up care as directed. SEEK MEDICAL CARE IF:  You have a fever.  You have redness, swelling, and pain around your incision site.  You have pus draining from your incision.  You have a rash. SEEK IMMEDIATE MEDICAL CARE IF:  You have bleeding from your incision site.  You have difficulty breathing.  You have chest pain.  You have severe abdominal pain.  You have leg pain.  You become dizzy and faint. Document Released: 10/08/2012 Document Reviewed: 10/08/2012 [facility] Patient Information 2015 [place], [place]. This information is not intended to replace advice given to you by your health care provider. Make sure you discuss any questions you have with your health care provider.

## 2013-12-19 NOTE — Discharge Summary (Signed)
Patient ID: Sylvia Henderson MRN: [id number] DOB/AGE: [birth year] 52 y.o.  Admit date: 12/18/2013 Discharge date: 12/19/2013  Admission Diagnoses: Symptomatic uterine fibroid (menorrhagia, pelvic cramping)  Discharge Diagnoses: Symptomatic uterine fibroid ,status post successful bilateral uterine artery embolization and fluoroscopic guided superior hypogastric nerve block on 12/18/2013 Active Problems:   Uterine fibroid   Menorrhagia   Intramural leiomyoma of uterus  Past Medical History  Diagnosis Date  . No pertinent past medical history   . Fibroids   . Anemia    Past Surgical History  Procedure Laterality Date  . Cesarean section    . Bunionectomy    . I&d extremity  05/31/2011    Procedure: IRRIGATION AND DEBRIDEMENT EXTREMITY;  Surgeon: Linna Hoff, MD;  Location: [facility];  Service: Orthopedics;  Laterality: Right;  Irrigation and Debridement Right index finger      Discharged Condition: good  Hospital Course: Sylvia Henderson is a 53 year old white female, patient of Dr. Janyth Pupa, who was recently referred to the interventional radiology service for further evaluation and possible treatment of symptomatic uterine fibroid. She has a history of menorrhagia, pelvic cramping/pain and urinary frequency secondary to a single large submucosal fibroid arising from the posterior uterine fundus. She was seen in consultation by Dr. Earleen Newport and deemed a suitable candidate for uterine fibroid embolization. On 12/18/2013 the patient underwent successful bilateral uterine artery embolization as well as fluoroscopic guided superior hypogastric nerve block under IV conscious sedation. The patient tolerated the procedure well and was subsequently admitted to the hospital for overnight observation. She was placed on a Dilaudid PCA pump and given antiemetics as needed. The patient did well overnight with exception of some mild pelvic cramping and intermittent nausea.  On the morning of discharge she was able to ambulate, tolerate her diet and void without difficulty. She continued to have some minimal pelvic cramping as expected but no further nausea. Above findings were discussed with IR attending Dr. Anselm Pancoast and the patient was deemed stable for discharge at this time. She will continue her current home medications. She was given prescriptions for Vicodin 5/325, #30/no refills, 1-2 tablets every 4-6 hours as needed for pain ; Colace 100 mg, #20, no refills, 1 tablet twice daily as needed for constipation; ibuprofen 600 mg, #20, no refills, 1 tablet every 6 hours for the next 5 days; Phenergan 25 mg, #10, no refills, 1 tablet every 6 hours as needed for nausea. The patient will be scheduled for follow-up in the interventional radiology clinic with Dr. Earleen Newport in 2 weeks. The patient was told to contact our service with any additional questions or concerns.  Consults: none  Significant Diagnostic Studies:  Results for orders placed or performed during the hospital encounter of 12/18/13  APTT  Result Value Ref Range   aPTT 27 24 - 37 seconds  Basic metabolic panel  Result Value Ref Range   Sodium 139 137 - 147 mEq/L   Potassium 4.0 3.7 - 5.3 mEq/L   Chloride 104 96 - 112 mEq/L   CO2 23 19 - 32 mEq/L   Glucose, Bld 92 70 - 99 mg/dL   BUN 16 6 - 23 mg/dL   Creatinine, Ser 0.93 0.50 - 1.10 mg/dL   Calcium 9.2 8.4 - 10.5 mg/dL   GFR calc non Af Amer 69 (L) >90 mL/min   GFR calc Af Amer 80 (L) >90 mL/min   Anion gap 12 5 - 15  CBC with Differential  Result Value  Ref Range   WBC 6.5 4.0 - 10.5 K/uL   RBC 3.78 (L) 3.87 - 5.11 MIL/uL   Hemoglobin 10.6 (L) 12.0 - 15.0 g/dL   HCT 32.5 (L) 36.0 - 46.0 %   MCV 86.0 78.0 - 100.0 fL   MCH 28.0 26.0 - 34.0 pg   MCHC 32.6 30.0 - 36.0 g/dL   RDW 15.5 11.5 - 15.5 %   Platelets 341 150 - 400 K/uL   Neutrophils Relative % 54 43 - 77 %   Neutro Abs 3.5 1.7 - 7.7 K/uL   Lymphocytes Relative 27 12 - 46 %   Lymphs Abs 1.8  0.7 - 4.0 K/uL   Monocytes Relative 12 3 - 12 %   Monocytes Absolute 0.8 0.1 - 1.0 K/uL   Eosinophils Relative 6 (H) 0 - 5 %   Eosinophils Absolute 0.4 0.0 - 0.7 K/uL   Basophils Relative 1 0 - 1 %   Basophils Absolute 0.1 0.0 - 0.1 K/uL  hCG, serum, qualitative  Result Value Ref Range   Preg, Serum NEGATIVE NEGATIVE  Protime-INR  Result Value Ref Range   Prothrombin Time 14.4 11.6 - 15.2 seconds   INR 1.11 0.00 - 1.49     Treatments: Ir Angiogram Pelvis Selective Or Supraselective  12/18/2013   CLINICAL DATA:  53 year old female with symptomatic uterine fibroids. The patient is experiencing 2 of the 3 classes of symptoms, including menometrorrhagia, and bulk type symptoms.  The patient presents today for bilateral uterine artery embolization.  EXAM: UTERINE FIBROID EMBOLIZATION  ULTRASOUND GUIDED RIGHT COMMON FEMORAL ARTERY ACCESS.  SUPERIOR HYPOGASTRIC NERVE BLOCK  BILATERAL UTERINE ARTERY ANGIOGRAM/PELVIC ARTERY ANGIOGRAM  CLOSURE DEVICE DEPLOYMENT FOR RIGHT COMMON FEMORAL ARTERY HEMOSTASIS  ANESTHESIA/SEDATION: 5.0 Mg IV Versed; 3.5 MG mg Dilaudid  Total Moderate Sedation Time: 117  Minutes.  Contrast Volume:  ml  Additional Medications: 2.0 g Ancef, 30 mg IV Toradol, 8.0 mg Zofran, 200 mcg nitroglycerin  FLUOROSCOPY TIME:  18 min, 48 second  PROCEDURE: The procedure, risks, benefits, and alternatives were explained to the patient. Questions regarding the procedure were encouraged and answered. The patient understands and consents to the procedure.  The right groin and periumbilical region were prepped with Betadine in a sterile fashion, and a sterile drape was applied covering the operative field. A sterile gown and sterile gloves were used for the procedure. Local anesthesia was provided with 1% Lidocaine.  Ultrasound survey of the right inguinal region was performed with images stored and sent to PACs. Once the patient was anesthetized generously with 1% lidocaine in the right inguinal  region, ultrasound guidance was used to access the right common femoral artery with a micropuncture needle. With excellent arterial blood flow returned, an 018 wire was advanced under fluoroscopy, observed enter the abdominal aorta. The needle was removed from the wire, and a micropuncture sheath was advanced over the wire. The inner dilator and wire were removed, and a 0.035 Bentson wire was advanced into the abdominal aorta. The micro sheath was removed and a standard 5 [place] vascular sheath was placed into the common femoral artery. The dilator was removed and the catheter was flushed.  A C2 Cobra catheter was advanced over the Bentson wire and used to select the left common iliac artery. The catheter was advanced to the distal common iliac artery.  Fluoroscopic guidance was then used to perform an anterior approach superior hypogastric block. A 20 cm, 22 gauge Chiba needle was advanced to the L5 level, just be low  the C2 Cobra catheter in the arterial tree. The needle was advanced with the image intensifier in of about 12 degrees of caudal angulation. 1% lidocaine was used to anesthetize the skin and subcutaneous tissues to the peritoneal level.  Once the [facility] catheter was felt to be on the anterior surface of L5, oblique images confirmed location of the needle. The stylet was removed and a small amount of contrast confirmed the needle tip in the paravertebral space. Approximately 15 cc of bupivacaine and dilute contrast was infused into the space under fluoroscopy. The needle was then removed.  The C2 Cobra catheter was then navigated with a Bentson wire into the internal iliac artery. Hand injection was used for road mapping of the internal iliac artery. The anterior division was identified, with a traditional downgoing branch of the urine artery.  Once identifying the left uterine artery, a high-flow Renegade catheter and Fathom wire were navigated to the distal vertical segment of the uterine artery.  Angiogram was performed.  We then embolized the left uterine artery using 500 - 700 Embospheres. Angiogram performed demonstrated 5 beat stasis at the completion of our embolization.  The C2 catheter was withdrawn into the common iliac artery and a Bentson wire was advanced to bring the tip of the C2 catheter into the distal external iliac artery. The Bentson wire was then reversed, with a stiff and used to form a Waltman loop.  The Waltman loop was then navigated first to the right internal iliac artery, with subsequent hand injection angiogram demonstrating a right-sided traditional arrangement of the urine artery. The Waltman loop was then engaged into the anterior division and right uterine artery. Angiogram of the right uterine artery was performed through the C2 catheter.  The renegade and Fathom combination were used to navigate to the horizontal segment of the right uterine artery. At the very proximal aspect of the horizontal segment of the right uterine artery, there was significant tortuosity. As the wire and catheter combination and gauge the proximal horizontal segment, vasospasm was induced. The vasospasm was treated with a total of 200 mcg of nitroglycerin.  With resolution of vasospasm, the catheter was stable at the proximal horizontal segment and we commenced embolization with 500 -700 embospheres.  Completion angiogram demonstrates 5 beat stasis on the right, within the early divisions of the right urine artery. Because there were a few branches with stagnant column of contrast, embolization was terminated at this point.  Plain film of the pelvis demonstrated circumferential staining of the uterus with contrast.  The Waltman loop was on looped over the bifurcation of the aorta and withdrawn. Angiogram of the right common femoral artery was performed. The right common femoral artery was closed with an Exoseal closure device.  The patient tolerated the procedure well and remained hemodynamically  stable throughout.  No complications were encountered and no significant blood loss was encountered.  COMPLICATIONS: None  FINDINGS: Bilateral uterine arteriography shows multiple enlarged hypervascular trunks supplying uterine fibroids.  Left uterine artery embolization was performed utilizing 1.5 vials of 500-700 micron sized particles. Completion arteriography demonstrates 5 beat stasis within the branches supplying uterine fibroids.  Right uterine artery embolization was performed utilizing 1.2 vials of 500 -700 Micron sized particles. Completion arteriography demonstrates 5 beat stasis of branches supplying uterine fibroids.  Adequate hemostasis was achieved at the femoral arteriotomy site after deployment of closure device.  Angiogram of the left uterine artery demonstrate early cervical vaginal branch at the proximal horizontal uterine artery. Navigating beyond this  branch induced some vasospasm which resolved after nitroglycerin. Embolization was performed beyond this branch.  IMPRESSION: Status post microsphere embolization of bilateral uterine artery for symptomatic uterine fibroids.  Status post fluoroscopic guided superior hypogastric block.  Deployment of Exoseal device for right common femoral artery hemostasis.  Signed,  Dulcy Fanny. Earleen Newport DO  [facility]  [facility]  PLAN: The patient will be admitted for overnight observation. She will be observed for pain control as well as post embolization syndrome.  The patient will have her Foley catheter removed after 6 hr of bed rest or in the morning, whichever is her preference.  Currently the patient is receiving Q 6 hour Toradol and PCA pump.  Signed,  Dulcy Fanny. Earleen Newport, DO  [facility]  [facility]   Electronically Signed   By: Corrie Mckusick D.O.   On: 12/18/2013 11:42   Ir Angiogram Pelvis Selective Or Supraselective  12/18/2013   CLINICAL DATA:  53 year old  female with symptomatic uterine fibroids. The patient is experiencing 2 of the 3 classes of symptoms, including menometrorrhagia, and bulk type symptoms.  The patient presents today for bilateral uterine artery embolization.  EXAM: UTERINE FIBROID EMBOLIZATION  ULTRASOUND GUIDED RIGHT COMMON FEMORAL ARTERY ACCESS.  SUPERIOR HYPOGASTRIC NERVE BLOCK  BILATERAL UTERINE ARTERY ANGIOGRAM/PELVIC ARTERY ANGIOGRAM  CLOSURE DEVICE DEPLOYMENT FOR RIGHT COMMON FEMORAL ARTERY HEMOSTASIS  ANESTHESIA/SEDATION: 5.0 Mg IV Versed; 3.5 MG mg Dilaudid  Total Moderate Sedation Time: 117  Minutes.  Contrast Volume:  ml  Additional Medications: 2.0 g Ancef, 30 mg IV Toradol, 8.0 mg Zofran, 200 mcg nitroglycerin  FLUOROSCOPY TIME:  18 min, 48 second  PROCEDURE: The procedure, risks, benefits, and alternatives were explained to the patient. Questions regarding the procedure were encouraged and answered. The patient understands and consents to the procedure.  The right groin and periumbilical region were prepped with Betadine in a sterile fashion, and a sterile drape was applied covering the operative field. A sterile gown and sterile gloves were used for the procedure. Local anesthesia was provided with 1% Lidocaine.  Ultrasound survey of the right inguinal region was performed with images stored and sent to PACs. Once the patient was anesthetized generously with 1% lidocaine in the right inguinal region, ultrasound guidance was used to access the right common femoral artery with a micropuncture needle. With excellent arterial blood flow returned, an 018 wire was advanced under fluoroscopy, observed enter the abdominal aorta. The needle was removed from the wire, and a micropuncture sheath was advanced over the wire. The inner dilator and wire were removed, and a 0.035 Bentson wire was advanced into the abdominal aorta. The micro sheath was removed and a standard 5 [place] vascular sheath was placed into the common femoral artery. The  dilator was removed and the catheter was flushed.  A C2 Cobra catheter was advanced over the Bentson wire and used to select the left common iliac artery. The catheter was advanced to the distal common iliac artery.  Fluoroscopic guidance was then used to perform an anterior approach superior hypogastric block. A 20 cm, 22 gauge Chiba needle was advanced to the L5 level, just be low the C2 Cobra catheter in the arterial tree. The needle was advanced with the image intensifier in of about 12 degrees of caudal angulation. 1% lidocaine was used to anesthetize the skin and subcutaneous tissues to the peritoneal level.  Once the [facility] catheter was felt to be on the anterior surface of L5, oblique images  confirmed location of the needle. The stylet was removed and a small amount of contrast confirmed the needle tip in the paravertebral space. Approximately 15 cc of bupivacaine and dilute contrast was infused into the space under fluoroscopy. The needle was then removed.  The C2 Cobra catheter was then navigated with a Bentson wire into the internal iliac artery. Hand injection was used for road mapping of the internal iliac artery. The anterior division was identified, with a traditional downgoing branch of the urine artery.  Once identifying the left uterine artery, a high-flow Renegade catheter and Fathom wire were navigated to the distal vertical segment of the uterine artery. Angiogram was performed.  We then embolized the left uterine artery using 500 - 700 Embospheres. Angiogram performed demonstrated 5 beat stasis at the completion of our embolization.  The C2 catheter was withdrawn into the common iliac artery and a Bentson wire was advanced to bring the tip of the C2 catheter into the distal external iliac artery. The Bentson wire was then reversed, with a stiff and used to form a Waltman loop.  The Waltman loop was then navigated first to the right internal iliac artery, with subsequent hand injection angiogram  demonstrating a right-sided traditional arrangement of the urine artery. The Waltman loop was then engaged into the anterior division and right uterine artery. Angiogram of the right uterine artery was performed through the C2 catheter.  The renegade and Fathom combination were used to navigate to the horizontal segment of the right uterine artery. At the very proximal aspect of the horizontal segment of the right uterine artery, there was significant tortuosity. As the wire and catheter combination and gauge the proximal horizontal segment, vasospasm was induced. The vasospasm was treated with a total of 200 mcg of nitroglycerin.  With resolution of vasospasm, the catheter was stable at the proximal horizontal segment and we commenced embolization with 500 -700 embospheres.  Completion angiogram demonstrates 5 beat stasis on the right, within the early divisions of the right urine artery. Because there were a few branches with stagnant column of contrast, embolization was terminated at this point.  Plain film of the pelvis demonstrated circumferential staining of the uterus with contrast.  The Waltman loop was on looped over the bifurcation of the aorta and withdrawn. Angiogram of the right common femoral artery was performed. The right common femoral artery was closed with an Exoseal closure device.  The patient tolerated the procedure well and remained hemodynamically stable throughout.  No complications were encountered and no significant blood loss was encountered.  COMPLICATIONS: None  FINDINGS: Bilateral uterine arteriography shows multiple enlarged hypervascular trunks supplying uterine fibroids.  Left uterine artery embolization was performed utilizing 1.5 vials of 500-700 micron sized particles. Completion arteriography demonstrates 5 beat stasis within the branches supplying uterine fibroids.  Right uterine artery embolization was performed utilizing 1.2 vials of 500 -700 Micron sized particles.  Completion arteriography demonstrates 5 beat stasis of branches supplying uterine fibroids.  Adequate hemostasis was achieved at the femoral arteriotomy site after deployment of closure device.  Angiogram of the left uterine artery demonstrate early cervical vaginal branch at the proximal horizontal uterine artery. Navigating beyond this branch induced some vasospasm which resolved after nitroglycerin. Embolization was performed beyond this branch.  IMPRESSION: Status post microsphere embolization of bilateral uterine artery for symptomatic uterine fibroids.  Status post fluoroscopic guided superior hypogastric block.  Deployment of Exoseal device for right common femoral artery hemostasis.  Signed,  Dulcy Fanny. Earleen Newport, DO  [facility]  [facility]  PLAN: The patient will be admitted for overnight observation. She will be observed for pain control as well as post embolization syndrome.  The patient will have her Foley catheter removed after 6 hr of bed rest or in the morning, whichever is her preference.  Currently the patient is receiving Q 6 hour Toradol and PCA pump.  Signed,  Dulcy Fanny. Earleen Newport, DO  [facility]  [facility]   Electronically Signed   By: Corrie Mckusick D.O.   On: 12/18/2013 11:42   Ir Angiogram Selective Each Additional Vessel  12/18/2013   CLINICAL DATA:  53 year old female with symptomatic uterine fibroids. The patient is experiencing 2 of the 3 classes of symptoms, including menometrorrhagia, and bulk type symptoms.  The patient presents today for bilateral uterine artery embolization.  EXAM: UTERINE FIBROID EMBOLIZATION  ULTRASOUND GUIDED RIGHT COMMON FEMORAL ARTERY ACCESS.  SUPERIOR HYPOGASTRIC NERVE BLOCK  BILATERAL UTERINE ARTERY ANGIOGRAM/PELVIC ARTERY ANGIOGRAM  CLOSURE DEVICE DEPLOYMENT FOR RIGHT COMMON FEMORAL ARTERY HEMOSTASIS  ANESTHESIA/SEDATION: 5.0 Mg IV Versed; 3.5 MG mg Dilaudid  Total  Moderate Sedation Time: 117  Minutes.  Contrast Volume:  ml  Additional Medications: 2.0 g Ancef, 30 mg IV Toradol, 8.0 mg Zofran, 200 mcg nitroglycerin  FLUOROSCOPY TIME:  18 min, 48 second  PROCEDURE: The procedure, risks, benefits, and alternatives were explained to the patient. Questions regarding the procedure were encouraged and answered. The patient understands and consents to the procedure.  The right groin and periumbilical region were prepped with Betadine in a sterile fashion, and a sterile drape was applied covering the operative field. A sterile gown and sterile gloves were used for the procedure. Local anesthesia was provided with 1% Lidocaine.  Ultrasound survey of the right inguinal region was performed with images stored and sent to PACs. Once the patient was anesthetized generously with 1% lidocaine in the right inguinal region, ultrasound guidance was used to access the right common femoral artery with a micropuncture needle. With excellent arterial blood flow returned, an 018 wire was advanced under fluoroscopy, observed enter the abdominal aorta. The needle was removed from the wire, and a micropuncture sheath was advanced over the wire. The inner dilator and wire were removed, and a 0.035 Bentson wire was advanced into the abdominal aorta. The micro sheath was removed and a standard 5 [place] vascular sheath was placed into the common femoral artery. The dilator was removed and the catheter was flushed.  A C2 Cobra catheter was advanced over the Bentson wire and used to select the left common iliac artery. The catheter was advanced to the distal common iliac artery.  Fluoroscopic guidance was then used to perform an anterior approach superior hypogastric block. A 20 cm, 22 gauge Chiba needle was advanced to the L5 level, just be low the C2 Cobra catheter in the arterial tree. The needle was advanced with the image intensifier in of about 12 degrees of caudal angulation. 1% lidocaine was used to  anesthetize the skin and subcutaneous tissues to the peritoneal level.  Once the [facility] catheter was felt to be on the anterior surface of L5, oblique images confirmed location of the needle. The stylet was removed and a small amount of contrast confirmed the needle tip in the paravertebral space. Approximately 15 cc of bupivacaine and dilute contrast was infused into the space under fluoroscopy. The needle was then removed.  The C2 Cobra catheter was then navigated with a Bentson wire into  the internal iliac artery. Hand injection was used for road mapping of the internal iliac artery. The anterior division was identified, with a traditional downgoing branch of the urine artery.  Once identifying the left uterine artery, a high-flow Renegade catheter and Fathom wire were navigated to the distal vertical segment of the uterine artery. Angiogram was performed.  We then embolized the left uterine artery using 500 - 700 Embospheres. Angiogram performed demonstrated 5 beat stasis at the completion of our embolization.  The C2 catheter was withdrawn into the common iliac artery and a Bentson wire was advanced to bring the tip of the C2 catheter into the distal external iliac artery. The Bentson wire was then reversed, with a stiff and used to form a Waltman loop.  The Waltman loop was then navigated first to the right internal iliac artery, with subsequent hand injection angiogram demonstrating a right-sided traditional arrangement of the urine artery. The Waltman loop was then engaged into the anterior division and right uterine artery. Angiogram of the right uterine artery was performed through the C2 catheter.  The renegade and Fathom combination were used to navigate to the horizontal segment of the right uterine artery. At the very proximal aspect of the horizontal segment of the right uterine artery, there was significant tortuosity. As the wire and catheter combination and gauge the proximal horizontal segment,  vasospasm was induced. The vasospasm was treated with a total of 200 mcg of nitroglycerin.  With resolution of vasospasm, the catheter was stable at the proximal horizontal segment and we commenced embolization with 500 -700 embospheres.  Completion angiogram demonstrates 5 beat stasis on the right, within the early divisions of the right urine artery. Because there were a few branches with stagnant column of contrast, embolization was terminated at this point.  Plain film of the pelvis demonstrated circumferential staining of the uterus with contrast.  The Waltman loop was on looped over the bifurcation of the aorta and withdrawn. Angiogram of the right common femoral artery was performed. The right common femoral artery was closed with an Exoseal closure device.  The patient tolerated the procedure well and remained hemodynamically stable throughout.  No complications were encountered and no significant blood loss was encountered.  COMPLICATIONS: None  FINDINGS: Bilateral uterine arteriography shows multiple enlarged hypervascular trunks supplying uterine fibroids.  Left uterine artery embolization was performed utilizing 1.5 vials of 500-700 micron sized particles. Completion arteriography demonstrates 5 beat stasis within the branches supplying uterine fibroids.  Right uterine artery embolization was performed utilizing 1.2 vials of 500 -700 Micron sized particles. Completion arteriography demonstrates 5 beat stasis of branches supplying uterine fibroids.  Adequate hemostasis was achieved at the femoral arteriotomy site after deployment of closure device.  Angiogram of the left uterine artery demonstrate early cervical vaginal branch at the proximal horizontal uterine artery. Navigating beyond this branch induced some vasospasm which resolved after nitroglycerin. Embolization was performed beyond this branch.  IMPRESSION: Status post microsphere embolization of bilateral uterine artery for symptomatic uterine  fibroids.  Status post fluoroscopic guided superior hypogastric block.  Deployment of Exoseal device for right common femoral artery hemostasis.  Signed,  Dulcy Fanny. Earleen Newport DO  [facility]  [facility]  PLAN: The patient will be admitted for overnight observation. She will be observed for pain control as well as post embolization syndrome.  The patient will have her Foley catheter removed after 6 hr of bed rest or in the morning, whichever is her preference.  Currently the  patient is receiving Q 6 hour Toradol and PCA pump.  Signed,  Dulcy Fanny. Earleen Newport, DO  [facility]  [facility]   Electronically Signed   By: Corrie Mckusick D.O.   On: 12/18/2013 11:42   Ir Angiogram Selective Each Additional Vessel  12/18/2013   CLINICAL DATA:  53 year old female with symptomatic uterine fibroids. The patient is experiencing 2 of the 3 classes of symptoms, including menometrorrhagia, and bulk type symptoms.  The patient presents today for bilateral uterine artery embolization.  EXAM: UTERINE FIBROID EMBOLIZATION  ULTRASOUND GUIDED RIGHT COMMON FEMORAL ARTERY ACCESS.  SUPERIOR HYPOGASTRIC NERVE BLOCK  BILATERAL UTERINE ARTERY ANGIOGRAM/PELVIC ARTERY ANGIOGRAM  CLOSURE DEVICE DEPLOYMENT FOR RIGHT COMMON FEMORAL ARTERY HEMOSTASIS  ANESTHESIA/SEDATION: 5.0 Mg IV Versed; 3.5 MG mg Dilaudid  Total Moderate Sedation Time: 117  Minutes.  Contrast Volume:  ml  Additional Medications: 2.0 g Ancef, 30 mg IV Toradol, 8.0 mg Zofran, 200 mcg nitroglycerin  FLUOROSCOPY TIME:  18 min, 48 second  PROCEDURE: The procedure, risks, benefits, and alternatives were explained to the patient. Questions regarding the procedure were encouraged and answered. The patient understands and consents to the procedure.  The right groin and periumbilical region were prepped with Betadine in a sterile fashion, and a sterile drape was applied covering the operative field. A  sterile gown and sterile gloves were used for the procedure. Local anesthesia was provided with 1% Lidocaine.  Ultrasound survey of the right inguinal region was performed with images stored and sent to PACs. Once the patient was anesthetized generously with 1% lidocaine in the right inguinal region, ultrasound guidance was used to access the right common femoral artery with a micropuncture needle. With excellent arterial blood flow returned, an 018 wire was advanced under fluoroscopy, observed enter the abdominal aorta. The needle was removed from the wire, and a micropuncture sheath was advanced over the wire. The inner dilator and wire were removed, and a 0.035 Bentson wire was advanced into the abdominal aorta. The micro sheath was removed and a standard 5 [place] vascular sheath was placed into the common femoral artery. The dilator was removed and the catheter was flushed.  A C2 Cobra catheter was advanced over the Bentson wire and used to select the left common iliac artery. The catheter was advanced to the distal common iliac artery.  Fluoroscopic guidance was then used to perform an anterior approach superior hypogastric block. A 20 cm, 22 gauge Chiba needle was advanced to the L5 level, just be low the C2 Cobra catheter in the arterial tree. The needle was advanced with the image intensifier in of about 12 degrees of caudal angulation. 1% lidocaine was used to anesthetize the skin and subcutaneous tissues to the peritoneal level.  Once the [facility] catheter was felt to be on the anterior surface of L5, oblique images confirmed location of the needle. The stylet was removed and a small amount of contrast confirmed the needle tip in the paravertebral space. Approximately 15 cc of bupivacaine and dilute contrast was infused into the space under fluoroscopy. The needle was then removed.  The C2 Cobra catheter was then navigated with a Bentson wire into the internal iliac artery. Hand injection was used for road  mapping of the internal iliac artery. The anterior division was identified, with a traditional downgoing branch of the urine artery.  Once identifying the left uterine artery, a high-flow Renegade catheter and Fathom wire were navigated to the distal vertical segment of the uterine artery. Angiogram  was performed.  We then embolized the left uterine artery using 500 - 700 Embospheres. Angiogram performed demonstrated 5 beat stasis at the completion of our embolization.  The C2 catheter was withdrawn into the common iliac artery and a Bentson wire was advanced to bring the tip of the C2 catheter into the distal external iliac artery. The Bentson wire was then reversed, with a stiff and used to form a Waltman loop.  The Waltman loop was then navigated first to the right internal iliac artery, with subsequent hand injection angiogram demonstrating a right-sided traditional arrangement of the urine artery. The Waltman loop was then engaged into the anterior division and right uterine artery. Angiogram of the right uterine artery was performed through the C2 catheter.  The renegade and Fathom combination were used to navigate to the horizontal segment of the right uterine artery. At the very proximal aspect of the horizontal segment of the right uterine artery, there was significant tortuosity. As the wire and catheter combination and gauge the proximal horizontal segment, vasospasm was induced. The vasospasm was treated with a total of 200 mcg of nitroglycerin.  With resolution of vasospasm, the catheter was stable at the proximal horizontal segment and we commenced embolization with 500 -700 embospheres.  Completion angiogram demonstrates 5 beat stasis on the right, within the early divisions of the right urine artery. Because there were a few branches with stagnant column of contrast, embolization was terminated at this point.  Plain film of the pelvis demonstrated circumferential staining of the uterus with contrast.   The Waltman loop was on looped over the bifurcation of the aorta and withdrawn. Angiogram of the right common femoral artery was performed. The right common femoral artery was closed with an Exoseal closure device.  The patient tolerated the procedure well and remained hemodynamically stable throughout.  No complications were encountered and no significant blood loss was encountered.  COMPLICATIONS: None  FINDINGS: Bilateral uterine arteriography shows multiple enlarged hypervascular trunks supplying uterine fibroids.  Left uterine artery embolization was performed utilizing 1.5 vials of 500-700 micron sized particles. Completion arteriography demonstrates 5 beat stasis within the branches supplying uterine fibroids.  Right uterine artery embolization was performed utilizing 1.2 vials of 500 -700 Micron sized particles. Completion arteriography demonstrates 5 beat stasis of branches supplying uterine fibroids.  Adequate hemostasis was achieved at the femoral arteriotomy site after deployment of closure device.  Angiogram of the left uterine artery demonstrate early cervical vaginal branch at the proximal horizontal uterine artery. Navigating beyond this branch induced some vasospasm which resolved after nitroglycerin. Embolization was performed beyond this branch.  IMPRESSION: Status post microsphere embolization of bilateral uterine artery for symptomatic uterine fibroids.  Status post fluoroscopic guided superior hypogastric block.  Deployment of Exoseal device for right common femoral artery hemostasis.  Signed,  Dulcy Fanny. Earleen Newport DO  [facility]  [facility] Radiology  PLAN: The patient will be admitted for overnight observation. She will be observed for pain control as well as post embolization syndrome.  The patient will have her Foley catheter removed after 6 hr of bed rest or in the morning, whichever is her preference.  Currently the patient is receiving Q 6 hour Toradol  and PCA pump.  Signed,  Dulcy Fanny. Earleen Newport, DO  [facility]  [facility] Radiology   Electronically Signed   By: Corrie Mckusick D.O.   On: 12/18/2013 11:42   Ir US Guide Vasc Access Right  12/18/2013   CLINICAL DATA:  53 year old female with symptomatic uterine fibroids. The patient is experiencing 2 of the 3 classes of symptoms, including menometrorrhagia, and bulk type symptoms.  The patient presents today for bilateral uterine artery embolization.  EXAM: UTERINE FIBROID EMBOLIZATION  ULTRASOUND GUIDED RIGHT COMMON FEMORAL ARTERY ACCESS.  SUPERIOR HYPOGASTRIC NERVE BLOCK  BILATERAL UTERINE ARTERY ANGIOGRAM/PELVIC ARTERY ANGIOGRAM  CLOSURE DEVICE DEPLOYMENT FOR RIGHT COMMON FEMORAL ARTERY HEMOSTASIS  ANESTHESIA/SEDATION: 5.0 Mg IV Versed; 3.5 MG mg Dilaudid  Total Moderate Sedation Time: 117  Minutes.  Contrast Volume:  ml  Additional Medications: 2.0 g Ancef, 30 mg IV Toradol, 8.0 mg Zofran, 200 mcg nitroglycerin  FLUOROSCOPY TIME:  18 min, 48 second  PROCEDURE: The procedure, risks, benefits, and alternatives were explained to the patient. Questions regarding the procedure were encouraged and answered. The patient understands and consents to the procedure.  The right groin and periumbilical region were prepped with Betadine in a sterile fashion, and a sterile drape was applied covering the operative field. A sterile gown and sterile gloves were used for the procedure. Local anesthesia was provided with 1% Lidocaine.  Ultrasound survey of the right inguinal region was performed with images stored and sent to PACs. Once the patient was anesthetized generously with 1% lidocaine in the right inguinal region, ultrasound guidance was used to access the right common femoral artery with a micropuncture needle. With excellent arterial blood flow returned, an 018 wire was advanced under fluoroscopy, observed enter the abdominal aorta. The needle was removed from the wire, and a  micropuncture sheath was advanced over the wire. The inner dilator and wire were removed, and a 0.035 Bentson wire was advanced into the abdominal aorta. The micro sheath was removed and a standard 5 [place] vascular sheath was placed into the common femoral artery. The dilator was removed and the catheter was flushed.  A C2 Cobra catheter was advanced over the Bentson wire and used to select the left common iliac artery. The catheter was advanced to the distal common iliac artery.  Fluoroscopic guidance was then used to perform an anterior approach superior hypogastric block. A 20 cm, 22 gauge Chiba needle was advanced to the L5 level, just be low the C2 Cobra catheter in the arterial tree. The needle was advanced with the image intensifier in of about 12 degrees of caudal angulation. 1% lidocaine was used to anesthetize the skin and subcutaneous tissues to the peritoneal level.  Once the [facility] catheter was felt to be on the anterior surface of L5, oblique images confirmed location of the needle. The stylet was removed and a small amount of contrast confirmed the needle tip in the paravertebral space. Approximately 15 cc of bupivacaine and dilute contrast was infused into the space under fluoroscopy. The needle was then removed.  The C2 Cobra catheter was then navigated with a Bentson wire into the internal iliac artery. Hand injection was used for road mapping of the internal iliac artery. The anterior division was identified, with a traditional downgoing branch of the urine artery.  Once identifying the left uterine artery, a high-flow Renegade catheter and Fathom wire were navigated to the distal vertical segment of the uterine artery. Angiogram was performed.  We then embolized the left uterine artery using 500 - 700 Embospheres. Angiogram performed demonstrated 5 beat stasis at the completion of our embolization.  The C2 catheter was withdrawn into the common iliac artery and a Bentson wire was advanced to  bring the tip of the C2 catheter into the distal external iliac  artery. The Bentson wire was then reversed, with a stiff and used to form a Waltman loop.  The Waltman loop was then navigated first to the right internal iliac artery, with subsequent hand injection angiogram demonstrating a right-sided traditional arrangement of the urine artery. The Waltman loop was then engaged into the anterior division and right uterine artery. Angiogram of the right uterine artery was performed through the C2 catheter.  The renegade and Fathom combination were used to navigate to the horizontal segment of the right uterine artery. At the very proximal aspect of the horizontal segment of the right uterine artery, there was significant tortuosity. As the wire and catheter combination and gauge the proximal horizontal segment, vasospasm was induced. The vasospasm was treated with a total of 200 mcg of nitroglycerin.  With resolution of vasospasm, the catheter was stable at the proximal horizontal segment and we commenced embolization with 500 -700 embospheres.  Completion angiogram demonstrates 5 beat stasis on the right, within the early divisions of the right urine artery. Because there were a few branches with stagnant column of contrast, embolization was terminated at this point.  Plain film of the pelvis demonstrated circumferential staining of the uterus with contrast.  The Waltman loop was on looped over the bifurcation of the aorta and withdrawn. Angiogram of the right common femoral artery was performed. The right common femoral artery was closed with an Exoseal closure device.  The patient tolerated the procedure well and remained hemodynamically stable throughout.  No complications were encountered and no significant blood loss was encountered.  COMPLICATIONS: None  FINDINGS: Bilateral uterine arteriography shows multiple enlarged hypervascular trunks supplying uterine fibroids.  Left uterine artery embolization was  performed utilizing 1.5 vials of 500-700 micron sized particles. Completion arteriography demonstrates 5 beat stasis within the branches supplying uterine fibroids.  Right uterine artery embolization was performed utilizing 1.2 vials of 500 -700 Micron sized particles. Completion arteriography demonstrates 5 beat stasis of branches supplying uterine fibroids.  Adequate hemostasis was achieved at the femoral arteriotomy site after deployment of closure device.  Angiogram of the left uterine artery demonstrate early cervical vaginal branch at the proximal horizontal uterine artery. Navigating beyond this branch induced some vasospasm which resolved after nitroglycerin. Embolization was performed beyond this branch.  IMPRESSION: Status post microsphere embolization of bilateral uterine artery for symptomatic uterine fibroids.  Status post fluoroscopic guided superior hypogastric block.  Deployment of Exoseal device for right common femoral artery hemostasis.  Signed,  Dulcy Fanny. Earleen Newport DO  [facility]  [facility] Radiology  PLAN: The patient will be admitted for overnight observation. She will be observed for pain control as well as post embolization syndrome.  The patient will have her Foley catheter removed after 6 hr of bed rest or in the morning, whichever is her preference.  Currently the patient is receiving Q 6 hour Toradol and PCA pump.  Signed,  Dulcy Fanny. Earleen Newport, DO  [facility]  [facility] Radiology   Electronically Signed   By: Corrie Mckusick D.O.   On: 12/18/2013 11:42   Ir Fluoro Guide Ndl Plmt / Bx  12/18/2013   CLINICAL DATA:  53 year old female with symptomatic uterine fibroids. The patient is experiencing 2 of the 3 classes of symptoms, including menometrorrhagia, and bulk type symptoms.  The patient presents today for bilateral uterine artery embolization.  EXAM: UTERINE FIBROID EMBOLIZATION  ULTRASOUND GUIDED RIGHT COMMON  FEMORAL ARTERY ACCESS.  SUPERIOR HYPOGASTRIC NERVE BLOCK  BILATERAL UTERINE ARTERY  ANGIOGRAM/PELVIC ARTERY ANGIOGRAM  CLOSURE DEVICE DEPLOYMENT FOR RIGHT COMMON FEMORAL ARTERY HEMOSTASIS  ANESTHESIA/SEDATION: 5.0 Mg IV Versed; 3.5 MG mg Dilaudid  Total Moderate Sedation Time: 117  Minutes.  Contrast Volume:  ml  Additional Medications: 2.0 g Ancef, 30 mg IV Toradol, 8.0 mg Zofran, 200 mcg nitroglycerin  FLUOROSCOPY TIME:  18 min, 48 second  PROCEDURE: The procedure, risks, benefits, and alternatives were explained to the patient. Questions regarding the procedure were encouraged and answered. The patient understands and consents to the procedure.  The right groin and periumbilical region were prepped with Betadine in a sterile fashion, and a sterile drape was applied covering the operative field. A sterile gown and sterile gloves were used for the procedure. Local anesthesia was provided with 1% Lidocaine.  Ultrasound survey of the right inguinal region was performed with images stored and sent to PACs. Once the patient was anesthetized generously with 1% lidocaine in the right inguinal region, ultrasound guidance was used to access the right common femoral artery with a micropuncture needle. With excellent arterial blood flow returned, an 018 wire was advanced under fluoroscopy, observed enter the abdominal aorta. The needle was removed from the wire, and a micropuncture sheath was advanced over the wire. The inner dilator and wire were removed, and a 0.035 Bentson wire was advanced into the abdominal aorta. The micro sheath was removed and a standard 5 [place] vascular sheath was placed into the common femoral artery. The dilator was removed and the catheter was flushed.  A C2 Cobra catheter was advanced over the Bentson wire and used to select the left common iliac artery. The catheter was advanced to the distal common iliac artery.  Fluoroscopic guidance was then used to perform an anterior approach superior  hypogastric block. A 20 cm, 22 gauge Chiba needle was advanced to the L5 level, just be low the C2 Cobra catheter in the arterial tree. The needle was advanced with the image intensifier in of about 12 degrees of caudal angulation. 1% lidocaine was used to anesthetize the skin and subcutaneous tissues to the peritoneal level.  Once the [facility] catheter was felt to be on the anterior surface of L5, oblique images confirmed location of the needle. The stylet was removed and a small amount of contrast confirmed the needle tip in the paravertebral space. Approximately 15 cc of bupivacaine and dilute contrast was infused into the space under fluoroscopy. The needle was then removed.  The C2 Cobra catheter was then navigated with a Bentson wire into the internal iliac artery. Hand injection was used for road mapping of the internal iliac artery. The anterior division was identified, with a traditional downgoing branch of the urine artery.  Once identifying the left uterine artery, a high-flow Renegade catheter and Fathom wire were navigated to the distal vertical segment of the uterine artery. Angiogram was performed.  We then embolized the left uterine artery using 500 - 700 Embospheres. Angiogram performed demonstrated 5 beat stasis at the completion of our embolization.  The C2 catheter was withdrawn into the common iliac artery and a Bentson wire was advanced to bring the tip of the C2 catheter into the distal external iliac artery. The Bentson wire was then reversed, with a stiff and used to form a Waltman loop.  The Waltman loop was then navigated first to the right internal iliac artery, with subsequent hand injection angiogram demonstrating a right-sided traditional arrangement of the urine artery. The Waltman loop was then engaged into the anterior division and  right uterine artery. Angiogram of the right uterine artery was performed through the C2 catheter.  The renegade and Fathom combination were used to  navigate to the horizontal segment of the right uterine artery. At the very proximal aspect of the horizontal segment of the right uterine artery, there was significant tortuosity. As the wire and catheter combination and gauge the proximal horizontal segment, vasospasm was induced. The vasospasm was treated with a total of 200 mcg of nitroglycerin.  With resolution of vasospasm, the catheter was stable at the proximal horizontal segment and we commenced embolization with 500 -700 embospheres.  Completion angiogram demonstrates 5 beat stasis on the right, within the early divisions of the right urine artery. Because there were a few branches with stagnant column of contrast, embolization was terminated at this point.  Plain film of the pelvis demonstrated circumferential staining of the uterus with contrast.  The Waltman loop was on looped over the bifurcation of the aorta and withdrawn. Angiogram of the right common femoral artery was performed. The right common femoral artery was closed with an Exoseal closure device.  The patient tolerated the procedure well and remained hemodynamically stable throughout.  No complications were encountered and no significant blood loss was encountered.  COMPLICATIONS: None  FINDINGS: Bilateral uterine arteriography shows multiple enlarged hypervascular trunks supplying uterine fibroids.  Left uterine artery embolization was performed utilizing 1.5 vials of 500-700 micron sized particles. Completion arteriography demonstrates 5 beat stasis within the branches supplying uterine fibroids.  Right uterine artery embolization was performed utilizing 1.2 vials of 500 -700 Micron sized particles. Completion arteriography demonstrates 5 beat stasis of branches supplying uterine fibroids.  Adequate hemostasis was achieved at the femoral arteriotomy site after deployment of closure device.  Angiogram of the left uterine artery demonstrate early cervical vaginal branch at the proximal  horizontal uterine artery. Navigating beyond this branch induced some vasospasm which resolved after nitroglycerin. Embolization was performed beyond this branch.  IMPRESSION: Status post microsphere embolization of bilateral uterine artery for symptomatic uterine fibroids.  Status post fluoroscopic guided superior hypogastric block.  Deployment of Exoseal device for right common femoral artery hemostasis.  Signed,  Dulcy Fanny. Earleen Newport DO  [facility]  [facility] Radiology  PLAN: The patient will be admitted for overnight observation. She will be observed for pain control as well as post embolization syndrome.  The patient will have her Foley catheter removed after 6 hr of bed rest or in the morning, whichever is her preference.  Currently the patient is receiving Q 6 hour Toradol and PCA pump.  Signed,  Dulcy Fanny. Earleen Newport, DO  [facility]  [facility]   Electronically Signed   By: Corrie Mckusick D.O.   On: 12/18/2013 11:42   Ir Embo Arterial Not Old Agency  12/18/2013   CLINICAL DATA:  53 year old female with symptomatic uterine fibroids. The patient is experiencing 2 of the 3 classes of symptoms, including menometrorrhagia, and bulk type symptoms.  The patient presents today for bilateral uterine artery embolization.  EXAM: UTERINE FIBROID EMBOLIZATION  ULTRASOUND GUIDED RIGHT COMMON FEMORAL ARTERY ACCESS.  SUPERIOR HYPOGASTRIC NERVE BLOCK  BILATERAL UTERINE ARTERY ANGIOGRAM/PELVIC ARTERY ANGIOGRAM  CLOSURE DEVICE DEPLOYMENT FOR RIGHT COMMON FEMORAL ARTERY HEMOSTASIS  ANESTHESIA/SEDATION: 5.0 Mg IV Versed; 3.5 MG mg Dilaudid  Total Moderate Sedation Time: 117  Minutes.  Contrast Volume:  ml  Additional Medications: 2.0 g Ancef, 30 mg IV Toradol, 8.0 mg Zofran, 200 mcg nitroglycerin  FLUOROSCOPY  TIME:  18 min, 48 second  PROCEDURE: The procedure, risks, benefits, and alternatives were explained to the patient.  Questions regarding the procedure were encouraged and answered. The patient understands and consents to the procedure.  The right groin and periumbilical region were prepped with Betadine in a sterile fashion, and a sterile drape was applied covering the operative field. A sterile gown and sterile gloves were used for the procedure. Local anesthesia was provided with 1% Lidocaine.  Ultrasound survey of the right inguinal region was performed with images stored and sent to PACs. Once the patient was anesthetized generously with 1% lidocaine in the right inguinal region, ultrasound guidance was used to access the right common femoral artery with a micropuncture needle. With excellent arterial blood flow returned, an 018 wire was advanced under fluoroscopy, observed enter the abdominal aorta. The needle was removed from the wire, and a micropuncture sheath was advanced over the wire. The inner dilator and wire were removed, and a 0.035 Bentson wire was advanced into the abdominal aorta. The micro sheath was removed and a standard 5 [place] vascular sheath was placed into the common femoral artery. The dilator was removed and the catheter was flushed.  A C2 Cobra catheter was advanced over the Bentson wire and used to select the left common iliac artery. The catheter was advanced to the distal common iliac artery.  Fluoroscopic guidance was then used to perform an anterior approach superior hypogastric block. A 20 cm, 22 gauge Chiba needle was advanced to the L5 level, just be low the C2 Cobra catheter in the arterial tree. The needle was advanced with the image intensifier in of about 12 degrees of caudal angulation. 1% lidocaine was used to anesthetize the skin and subcutaneous tissues to the peritoneal level.  Once the [facility] catheter was felt to be on the anterior surface of L5, oblique images confirmed location of the needle. The stylet was removed and a small amount of contrast confirmed the needle tip in the  paravertebral space. Approximately 15 cc of bupivacaine and dilute contrast was infused into the space under fluoroscopy. The needle was then removed.  The C2 Cobra catheter was then navigated with a Bentson wire into the internal iliac artery. Hand injection was used for road mapping of the internal iliac artery. The anterior division was identified, with a traditional downgoing branch of the urine artery.  Once identifying the left uterine artery, a high-flow Renegade catheter and Fathom wire were navigated to the distal vertical segment of the uterine artery. Angiogram was performed.  We then embolized the left uterine artery using 500 - 700 Embospheres. Angiogram performed demonstrated 5 beat stasis at the completion of our embolization.  The C2 catheter was withdrawn into the common iliac artery and a Bentson wire was advanced to bring the tip of the C2 catheter into the distal external iliac artery. The Bentson wire was then reversed, with a stiff and used to form a Waltman loop.  The Waltman loop was then navigated first to the right internal iliac artery, with subsequent hand injection angiogram demonstrating a right-sided traditional arrangement of the urine artery. The Waltman loop was then engaged into the anterior division and right uterine artery. Angiogram of the right uterine artery was performed through the C2 catheter.  The renegade and Fathom combination were used to navigate to the horizontal segment of the right uterine artery. At the very proximal aspect of the horizontal segment of the right uterine artery, there was significant tortuosity. As  the wire and catheter combination and gauge the proximal horizontal segment, vasospasm was induced. The vasospasm was treated with a total of 200 mcg of nitroglycerin.  With resolution of vasospasm, the catheter was stable at the proximal horizontal segment and we commenced embolization with 500 -700 embospheres.  Completion angiogram demonstrates 5 beat  stasis on the right, within the early divisions of the right urine artery. Because there were a few branches with stagnant column of contrast, embolization was terminated at this point.  Plain film of the pelvis demonstrated circumferential staining of the uterus with contrast.  The Waltman loop was on looped over the bifurcation of the aorta and withdrawn. Angiogram of the right common femoral artery was performed. The right common femoral artery was closed with an Exoseal closure device.  The patient tolerated the procedure well and remained hemodynamically stable throughout.  No complications were encountered and no significant blood loss was encountered.  COMPLICATIONS: None  FINDINGS: Bilateral uterine arteriography shows multiple enlarged hypervascular trunks supplying uterine fibroids.  Left uterine artery embolization was performed utilizing 1.5 vials of 500-700 micron sized particles. Completion arteriography demonstrates 5 beat stasis within the branches supplying uterine fibroids.  Right uterine artery embolization was performed utilizing 1.2 vials of 500 -700 Micron sized particles. Completion arteriography demonstrates 5 beat stasis of branches supplying uterine fibroids.  Adequate hemostasis was achieved at the femoral arteriotomy site after deployment of closure device.  Angiogram of the left uterine artery demonstrate early cervical vaginal branch at the proximal horizontal uterine artery. Navigating beyond this branch induced some vasospasm which resolved after nitroglycerin. Embolization was performed beyond this branch.  IMPRESSION: Status post microsphere embolization of bilateral uterine artery for symptomatic uterine fibroids.  Status post fluoroscopic guided superior hypogastric block.  Deployment of Exoseal device for right common femoral artery hemostasis.  Signed,  Dulcy Fanny. Earleen Newport DO  [facility]  [facility]  PLAN: The patient will be  admitted for overnight observation. She will be observed for pain control as well as post embolization syndrome.  The patient will have her Foley catheter removed after 6 hr of bed rest or in the morning, whichever is her preference.  Currently the patient is receiving Q 6 hour Toradol and PCA pump.  Signed,  Dulcy Fanny. Earleen Newport, DO  [facility]  [facility]   Electronically Signed   By: Corrie Mckusick D.O.   On: 12/18/2013 11:42     Discharge Exam: Blood pressure 110/48, pulse 55, temperature 98 F (36.7 C), temperature source Oral, resp. rate 13, height 5\' 2"  (1.575 m), weight 121 lb (54.885 kg), last menstrual period 12/18/2013, SpO2 99 %. The patient is awake, alert oriented. Chest with few fine bibasilar crackles; heart with regular rate and rhythm, soft murmur; abdomen soft, positive bowel sounds, fibroid uterus, minimal pelvic tenderness to palpation; puncture site right common femoral artery and anterior mid pelvic region clean, dry, minimal ecchymosis, mildly tender to palpation, no definite hematoma. Intact distal pulses, no lower extremity edema.  Disposition: home  Discharge Instructions    Call MD for:  difficulty breathing, headache or visual disturbances    Complete by:  As directed      Call MD for:  extreme fatigue    Complete by:  As directed      Call MD for:  hives    Complete by:  As directed      Call MD for:  persistant dizziness or light-headedness  Complete by:  As directed      Call MD for:  persistant nausea and vomiting    Complete by:  As directed      Call MD for:  redness, tenderness, or signs of infection (pain, swelling, redness, odor or green/yellow discharge around incision site)    Complete by:  As directed      Call MD for:  severe uncontrolled pain    Complete by:  As directed      Call MD for:  temperature >100.4    Complete by:  As directed      Change dressing (specify)    Complete by:  As directed   May  change right groin dressing daily and apply bandaid to site for next 2-3 days; may wash site with soap and water     Diet - low sodium heart healthy    Complete by:  As directed      Driving Restrictions    Complete by:  As directed   No driving for next 48 hours     Increase activity slowly    Complete by:  As directed      Lifting restrictions    Complete by:  As directed   No heavy lifting for next 3-4 days     May shower / Bathe    Complete by:  As directed      May walk up steps    Complete by:  As directed      Sexual Activity Restrictions    Complete by:  As directed   No sexual intercourse for 1 week            Medication List    TAKE these medications        ferrous sulfate 325 (65 FE) MG tablet  Take 325 mg by mouth daily with breakfast.     ibuprofen 600 MG tablet  Commonly known as:  ADVIL,MOTRIN  Take 600 mg by mouth every 6 (six) hours as needed.     MAGNESIUM PO  Take 1 tablet by mouth daily.     multivitamin with minerals tablet  Take 1 tablet by mouth daily.           Follow-up Information    Follow up with WAGNER, JAIME, DO.   Specialty:  Interventional Radiology   Why:  radiology will call you with follow up appt with Dr. Earleen Newport in 2 weeks; call [phone] or [phone] with any questions   Contact information:   [address] [place] [address] [phone]       Follow up with Janyth Pupa, M, DO.   Specialty:  Obstetrics and Gynecology   Why:  continue current care with Dr. Tama Headings information:   [address] [place] [id number] [phone]       Signed: Nash Mantis 12/19/2013, 8:25 AM

## 2013-12-19 NOTE — Discharge Summary (Signed)
Reviewed discharge instructions with pt including medications, precautions, and follow-up appointments.  Discussed application of pressure over incision site if bleeding would occur.  Pt verbalized understanding of all instructions without questions.  Pt being d/c to home into care of husband who just arrived.

## 2013-12-21 ENCOUNTER — Other Ambulatory Visit: Payer: Self-pay | Admitting: Interventional Radiology

## 2013-12-21 DIAGNOSIS — D251 Intramural leiomyoma of uterus: Secondary | ICD-10-CM

## 2013-12-29 ENCOUNTER — Telehealth: Payer: Self-pay | Admitting: Radiology

## 2013-12-29 NOTE — Telephone Encounter (Signed)
Patient doing well 10 days post [place].  Minimal spotting, without cramping, without odor.  Afebrile.    Per Dr Earleen Newport:  Patient may resume jog/walk, slower pace, 5-6 miles/day.  Patient is training for an early February 2016 event.  Instructed patient to call for questions/concerns.  Patient states that she understands.    Follow up appointment with Dr Earleen Newport scheduled for January 14, 2014 at 9:30 am.   Reece Levy, RN 12/29/2013 9:09 AM

## 2013-12-30 ENCOUNTER — Other Ambulatory Visit: Payer: Self-pay | Admitting: Radiology

## 2014-01-14 ENCOUNTER — Ambulatory Visit
Admission: RE | Admit: 2014-01-14 | Discharge: 2014-01-14 | Disposition: A | Discharge status: Home / Self Care | Payer: Managed Care, Other (non HMO) | Source: Ambulatory Visit | Attending: Radiology | Admitting: Radiology

## 2014-01-14 DIAGNOSIS — N920 Excessive and frequent menstruation with regular cycle: Secondary | ICD-10-CM | POA: Insufficient documentation

## 2014-01-14 DIAGNOSIS — D259 Leiomyoma of uterus, unspecified: Secondary | ICD-10-CM

## 2014-01-14 HISTORY — PX: IR GENERIC HISTORICAL: IMG1180011

## 2014-01-14 NOTE — Progress Notes (Signed)
3.5 week follow up [place] for symptomatic uterine fibroids.  Afebrile.  Minimal spotting.  No odor.    Patient has resumed jogging and is training for a 20 mile trail run competition scheduled for early February 2016.  Doing well.  No complaints.  Will contact patient to schedule 6 month follow up.  Davi Rotan [place], RN 01/14/2014 10:00 AM

## 2014-01-14 NOTE — Care Management Note (Signed)
.   Chief Complaint: Chief Complaint  Patient presents with  . Follow-up    3.5 week follow up [place]    Referring Physician(s): Allred,D Lennette Bihari  History of Present Illness: Sylvia Henderson is a 54 y.o. female presenting for routing, scheduled clinic follow up visit, SP bilateral uterine artery embolization and superior hypogastric nerve block for symptomatic fibroids.   She underwent the procedure on 12/18/2013, with one night observation in the hospital [facility]) for expected post-op recovery period.  She was discharged home on the morning of 12/19/2013 with no significant discomfort and with her goals met for discharge.    Since that time, she reports that she has had no significant pain/discomfort, and in fact has continued to train for an upcoming trail-running race/competition in which she is participating in early February.  She report that she has occasional minimal spotting, but none of which is concerning.  She denies any worrisome discharge.  She even feels that the size of her stomach has decreased in size since the procedure.  She describes having had a slight discomfort along her lower right abdomen, which was transient, and has resolved, and which did not cause her any change in her daily activity.  This was not associated with other symptoms.  She has no other complaints, and states that she is, at this point, very satisfied with her experience.    I discussed with Sylvia Henderson the expectations and natural history of treated fibroids over the first 6 months of the post-operative period.  Specifically, I discussed the first 3 cycles of her menses, and that it is usual that the first 1 or 2 periods have unpredictable amounts of bleeding, ranging from none to significant amounts.  If there is bleeding over the first 1 or 2 cycles, this is usually decreasing by the 3rd or 4th cycle.  I have counselled her that her bleeding and bulk type symptoms may take as long as 6 to 12 months to  improve, again with >90% of patients experiencing improvement at 12 months.    I have encouraged her to continue her activities, with no reservations.    Past Medical History  Diagnosis Date  . No pertinent past medical history   . Fibroids   . Anemia     Past Surgical History  Procedure Laterality Date  . Cesarean section    . Bunionectomy    . I&d extremity  05/31/2011    Procedure: IRRIGATION AND DEBRIDEMENT EXTREMITY;  Surgeon: Linna Hoff, MD;  Location: [facility];  Service: Orthopedics;  Laterality: Right;  Irrigation and Debridement Right index finger    Allergies: Review of patient's allergies indicates no known allergies.  Medications: Prior to Admission medications   Medication Sig Start Date End Date Taking? Authorizing Provider  ferrous sulfate 325 (65 FE) MG tablet Take 325 mg by mouth daily with breakfast.    Historical Provider, MD  ibuprofen (ADVIL,MOTRIN) 600 MG tablet Take 600 mg by mouth every 6 (six) hours as needed.    Historical Provider, MD  MAGNESIUM PO Take 1 tablet by mouth daily.    Historical Provider, MD  Multiple Vitamins-Minerals (MULTIVITAMIN WITH MINERALS) tablet Take 1 tablet by mouth daily.    Historical Provider, MD    No family history on file.  History   Social History  . Marital Status: Married    Spouse Name: N/A    Number of Children: N/A  . Years of Education: N/A   Social  History Main Topics  . Smoking status: Never Smoker   . Smokeless tobacco: Never Used  . Alcohol Use: 3.0 oz/week    5 Glasses of wine per week  . Drug Use: No  . Sexual Activity: Not on file   Other Topics Concern  . Not on file   Social History Narrative    ECOG Status: 0 - Asymptomatic  Review of Systems: A 12 point ROS discussed and pertinent positives are indicated in the HPI above.  All other systems are negative.  Review of Systems  Vital Signs: BP 143/73 mmHg  Pulse 58  Temp(Src) 98.3 F (36.8 C) (Oral)  Resp 14   SpO2 100%  LMP 12/18/2013  Physical Exam  Targeted physical exam: No abdominal tenderness. No palpable abnormality at the [facility] access site, with no evidence of hematoma or pseudoaneurysm.  In good repair with no wound. Palpable pulse.   Imaging: Ir Angiogram Pelvis Selective Or Supraselective  12/18/2013   CLINICAL DATA:  54 year old female with symptomatic uterine fibroids. The patient is experiencing 2 of the 3 classes of symptoms, including menometrorrhagia, and bulk type symptoms.  The patient presents today for bilateral uterine artery embolization.  EXAM: UTERINE FIBROID EMBOLIZATION  ULTRASOUND GUIDED RIGHT COMMON FEMORAL ARTERY ACCESS.  SUPERIOR HYPOGASTRIC NERVE BLOCK  BILATERAL UTERINE ARTERY ANGIOGRAM/PELVIC ARTERY ANGIOGRAM  CLOSURE DEVICE DEPLOYMENT FOR RIGHT COMMON FEMORAL ARTERY HEMOSTASIS  ANESTHESIA/SEDATION: 5.0 Mg IV Versed; 3.5 MG mg Dilaudid  Total Moderate Sedation Time: 117  Minutes.  Contrast Volume:  ml  Additional Medications: 2.0 g Ancef, 30 mg IV Toradol, 8.0 mg Zofran, 200 mcg nitroglycerin  FLUOROSCOPY TIME:  18 min, 48 second  PROCEDURE: The procedure, risks, benefits, and alternatives were explained to the patient. Questions regarding the procedure were encouraged and answered. The patient understands and consents to the procedure.  The right groin and periumbilical region were prepped with Betadine in a sterile fashion, and a sterile drape was applied covering the operative field. A sterile gown and sterile gloves were used for the procedure. Local anesthesia was provided with 1% Lidocaine.  Ultrasound survey of the right inguinal region was performed with images stored and sent to PACs. Once the patient was anesthetized generously with 1% lidocaine in the right inguinal region, ultrasound guidance was used to access the right common femoral artery with a micropuncture needle. With excellent arterial blood flow returned, an 018 wire was advanced under fluoroscopy,  observed enter the abdominal aorta. The needle was removed from the wire, and a micropuncture sheath was advanced over the wire. The inner dilator and wire were removed, and a 0.035 Bentson wire was advanced into the abdominal aorta. The micro sheath was removed and a standard 5 [place] vascular sheath was placed into the common femoral artery. The dilator was removed and the catheter was flushed.  A C2 Cobra catheter was advanced over the Bentson wire and used to select the left common iliac artery. The catheter was advanced to the distal common iliac artery.  Fluoroscopic guidance was then used to perform an anterior approach superior hypogastric block. A 20 cm, 22 gauge Chiba needle was advanced to the L5 level, just be low the C2 Cobra catheter in the arterial tree. The needle was advanced with the image intensifier in of about 12 degrees of caudal angulation. 1% lidocaine was used to anesthetize the skin and subcutaneous tissues to the peritoneal level.  Once the [facility] catheter was felt to be on the anterior surface of L5, oblique images  confirmed location of the needle. The stylet was removed and a small amount of contrast confirmed the needle tip in the paravertebral space. Approximately 15 cc of bupivacaine and dilute contrast was infused into the space under fluoroscopy. The needle was then removed.  The C2 Cobra catheter was then navigated with a Bentson wire into the internal iliac artery. Hand injection was used for road mapping of the internal iliac artery. The anterior division was identified, with a traditional downgoing branch of the urine artery.  Once identifying the left uterine artery, a high-flow Renegade catheter and Fathom wire were navigated to the distal vertical segment of the uterine artery. Angiogram was performed.  We then embolized the left uterine artery using 500 - 700 Embospheres. Angiogram performed demonstrated 5 beat stasis at the completion of our embolization.  The C2 catheter  was withdrawn into the common iliac artery and a Bentson wire was advanced to bring the tip of the C2 catheter into the distal external iliac artery. The Bentson wire was then reversed, with a stiff and used to form a Waltman loop.  The Waltman loop was then navigated first to the right internal iliac artery, with subsequent hand injection angiogram demonstrating a right-sided traditional arrangement of the urine artery. The Waltman loop was then engaged into the anterior division and right uterine artery. Angiogram of the right uterine artery was performed through the C2 catheter.  The renegade and Fathom combination were used to navigate to the horizontal segment of the right uterine artery. At the very proximal aspect of the horizontal segment of the right uterine artery, there was significant tortuosity. As the wire and catheter combination and gauge the proximal horizontal segment, vasospasm was induced. The vasospasm was treated with a total of 200 mcg of nitroglycerin.  With resolution of vasospasm, the catheter was stable at the proximal horizontal segment and we commenced embolization with 500 -700 embospheres.  Completion angiogram demonstrates 5 beat stasis on the right, within the early divisions of the right urine artery. Because there were a few branches with stagnant column of contrast, embolization was terminated at this point.  Plain film of the pelvis demonstrated circumferential staining of the uterus with contrast.  The Waltman loop was on looped over the bifurcation of the aorta and withdrawn. Angiogram of the right common femoral artery was performed. The right common femoral artery was closed with an Exoseal closure device.  The patient tolerated the procedure well and remained hemodynamically stable throughout.  No complications were encountered and no significant blood loss was encountered.  COMPLICATIONS: None  FINDINGS: Bilateral uterine arteriography shows multiple enlarged hypervascular  trunks supplying uterine fibroids.  Left uterine artery embolization was performed utilizing 1.5 vials of 500-700 micron sized particles. Completion arteriography demonstrates 5 beat stasis within the branches supplying uterine fibroids.  Right uterine artery embolization was performed utilizing 1.2 vials of 500 -700 Micron sized particles. Completion arteriography demonstrates 5 beat stasis of branches supplying uterine fibroids.  Adequate hemostasis was achieved at the femoral arteriotomy site after deployment of closure device.  Angiogram of the left uterine artery demonstrate early cervical vaginal branch at the proximal horizontal uterine artery. Navigating beyond this branch induced some vasospasm which resolved after nitroglycerin. Embolization was performed beyond this branch.  IMPRESSION: Status post microsphere embolization of bilateral uterine artery for symptomatic uterine fibroids.  Status post fluoroscopic guided superior hypogastric block.  Deployment of Exoseal device for right common femoral artery hemostasis.  Signed,  Dulcy Fanny. Earleen Newport, DO  [facility]  [facility]  PLAN: The patient will be admitted for overnight observation. She will be observed for pain control as well as post embolization syndrome.  The patient will have her Foley catheter removed after 6 hr of bed rest or in the morning, whichever is her preference.  Currently the patient is receiving Q 6 hour Toradol and PCA pump.  Signed,  Dulcy Fanny. Earleen Newport, DO  [facility]  [facility]   Electronically Signed   By: Corrie Mckusick D.O.   On: 12/18/2013 11:42   Ir Angiogram Pelvis Selective Or Supraselective  12/18/2013   CLINICAL DATA:  54 year old female with symptomatic uterine fibroids. The patient is experiencing 2 of the 3 classes of symptoms, including menometrorrhagia, and bulk type symptoms.  The patient presents today for bilateral uterine  artery embolization.  EXAM: UTERINE FIBROID EMBOLIZATION  ULTRASOUND GUIDED RIGHT COMMON FEMORAL ARTERY ACCESS.  SUPERIOR HYPOGASTRIC NERVE BLOCK  BILATERAL UTERINE ARTERY ANGIOGRAM/PELVIC ARTERY ANGIOGRAM  CLOSURE DEVICE DEPLOYMENT FOR RIGHT COMMON FEMORAL ARTERY HEMOSTASIS  ANESTHESIA/SEDATION: 5.0 Mg IV Versed; 3.5 MG mg Dilaudid  Total Moderate Sedation Time: 117  Minutes.  Contrast Volume:  ml  Additional Medications: 2.0 g Ancef, 30 mg IV Toradol, 8.0 mg Zofran, 200 mcg nitroglycerin  FLUOROSCOPY TIME:  18 min, 48 second  PROCEDURE: The procedure, risks, benefits, and alternatives were explained to the patient. Questions regarding the procedure were encouraged and answered. The patient understands and consents to the procedure.  The right groin and periumbilical region were prepped with Betadine in a sterile fashion, and a sterile drape was applied covering the operative field. A sterile gown and sterile gloves were used for the procedure. Local anesthesia was provided with 1% Lidocaine.  Ultrasound survey of the right inguinal region was performed with images stored and sent to PACs. Once the patient was anesthetized generously with 1% lidocaine in the right inguinal region, ultrasound guidance was used to access the right common femoral artery with a micropuncture needle. With excellent arterial blood flow returned, an 018 wire was advanced under fluoroscopy, observed enter the abdominal aorta. The needle was removed from the wire, and a micropuncture sheath was advanced over the wire. The inner dilator and wire were removed, and a 0.035 Bentson wire was advanced into the abdominal aorta. The micro sheath was removed and a standard 5 [place] vascular sheath was placed into the common femoral artery. The dilator was removed and the catheter was flushed.  A C2 Cobra catheter was advanced over the Bentson wire and used to select the left common iliac artery. The catheter was advanced to the distal common iliac  artery.  Fluoroscopic guidance was then used to perform an anterior approach superior hypogastric block. A 20 cm, 22 gauge Chiba needle was advanced to the L5 level, just be low the C2 Cobra catheter in the arterial tree. The needle was advanced with the image intensifier in of about 12 degrees of caudal angulation. 1% lidocaine was used to anesthetize the skin and subcutaneous tissues to the peritoneal level.  Once the [facility] catheter was felt to be on the anterior surface of L5, oblique images confirmed location of the needle. The stylet was removed and a small amount of contrast confirmed the needle tip in the paravertebral space. Approximately 15 cc of bupivacaine and dilute contrast was infused into the space under fluoroscopy. The needle was then removed.  The C2 Cobra catheter was then navigated with a Bentson wire into  the internal iliac artery. Hand injection was used for road mapping of the internal iliac artery. The anterior division was identified, with a traditional downgoing branch of the urine artery.  Once identifying the left uterine artery, a high-flow Renegade catheter and Fathom wire were navigated to the distal vertical segment of the uterine artery. Angiogram was performed.  We then embolized the left uterine artery using 500 - 700 Embospheres. Angiogram performed demonstrated 5 beat stasis at the completion of our embolization.  The C2 catheter was withdrawn into the common iliac artery and a Bentson wire was advanced to bring the tip of the C2 catheter into the distal external iliac artery. The Bentson wire was then reversed, with a stiff and used to form a Waltman loop.  The Waltman loop was then navigated first to the right internal iliac artery, with subsequent hand injection angiogram demonstrating a right-sided traditional arrangement of the urine artery. The Waltman loop was then engaged into the anterior division and right uterine artery. Angiogram of the right uterine artery was  performed through the C2 catheter.  The renegade and Fathom combination were used to navigate to the horizontal segment of the right uterine artery. At the very proximal aspect of the horizontal segment of the right uterine artery, there was significant tortuosity. As the wire and catheter combination and gauge the proximal horizontal segment, vasospasm was induced. The vasospasm was treated with a total of 200 mcg of nitroglycerin.  With resolution of vasospasm, the catheter was stable at the proximal horizontal segment and we commenced embolization with 500 -700 embospheres.  Completion angiogram demonstrates 5 beat stasis on the right, within the early divisions of the right urine artery. Because there were a few branches with stagnant column of contrast, embolization was terminated at this point.  Plain film of the pelvis demonstrated circumferential staining of the uterus with contrast.  The Waltman loop was on looped over the bifurcation of the aorta and withdrawn. Angiogram of the right common femoral artery was performed. The right common femoral artery was closed with an Exoseal closure device.  The patient tolerated the procedure well and remained hemodynamically stable throughout.  No complications were encountered and no significant blood loss was encountered.  COMPLICATIONS: None  FINDINGS: Bilateral uterine arteriography shows multiple enlarged hypervascular trunks supplying uterine fibroids.  Left uterine artery embolization was performed utilizing 1.5 vials of 500-700 micron sized particles. Completion arteriography demonstrates 5 beat stasis within the branches supplying uterine fibroids.  Right uterine artery embolization was performed utilizing 1.2 vials of 500 -700 Micron sized particles. Completion arteriography demonstrates 5 beat stasis of branches supplying uterine fibroids.  Adequate hemostasis was achieved at the femoral arteriotomy site after deployment of closure device.  Angiogram of the  left uterine artery demonstrate early cervical vaginal branch at the proximal horizontal uterine artery. Navigating beyond this branch induced some vasospasm which resolved after nitroglycerin. Embolization was performed beyond this branch.  IMPRESSION: Status post microsphere embolization of bilateral uterine artery for symptomatic uterine fibroids.  Status post fluoroscopic guided superior hypogastric block.  Deployment of Exoseal device for right common femoral artery hemostasis.  Signed,  Dulcy Fanny. Earleen Newport DO  [facility]  [facility]  PLAN: The patient will be admitted for overnight observation. She will be observed for pain control as well as post embolization syndrome.  The patient will have her Foley catheter removed after 6 hr of bed rest or in the morning, whichever is her preference.  Currently the  patient is receiving Q 6 hour Toradol and PCA pump.  Signed,  Dulcy Fanny. Earleen Newport, DO  [facility]  [facility]   Electronically Signed   By: Corrie Mckusick D.O.   On: 12/18/2013 11:42   Ir Angiogram Selective Each Additional Vessel  12/18/2013   CLINICAL DATA:  54 year old female with symptomatic uterine fibroids. The patient is experiencing 2 of the 3 classes of symptoms, including menometrorrhagia, and bulk type symptoms.  The patient presents today for bilateral uterine artery embolization.  EXAM: UTERINE FIBROID EMBOLIZATION  ULTRASOUND GUIDED RIGHT COMMON FEMORAL ARTERY ACCESS.  SUPERIOR HYPOGASTRIC NERVE BLOCK  BILATERAL UTERINE ARTERY ANGIOGRAM/PELVIC ARTERY ANGIOGRAM  CLOSURE DEVICE DEPLOYMENT FOR RIGHT COMMON FEMORAL ARTERY HEMOSTASIS  ANESTHESIA/SEDATION: 5.0 Mg IV Versed; 3.5 MG mg Dilaudid  Total Moderate Sedation Time: 117  Minutes.  Contrast Volume:  ml  Additional Medications: 2.0 g Ancef, 30 mg IV Toradol, 8.0 mg Zofran, 200 mcg nitroglycerin  FLUOROSCOPY TIME:  18 min, 48 second  PROCEDURE: The  procedure, risks, benefits, and alternatives were explained to the patient. Questions regarding the procedure were encouraged and answered. The patient understands and consents to the procedure.  The right groin and periumbilical region were prepped with Betadine in a sterile fashion, and a sterile drape was applied covering the operative field. A sterile gown and sterile gloves were used for the procedure. Local anesthesia was provided with 1% Lidocaine.  Ultrasound survey of the right inguinal region was performed with images stored and sent to PACs. Once the patient was anesthetized generously with 1% lidocaine in the right inguinal region, ultrasound guidance was used to access the right common femoral artery with a micropuncture needle. With excellent arterial blood flow returned, an 018 wire was advanced under fluoroscopy, observed enter the abdominal aorta. The needle was removed from the wire, and a micropuncture sheath was advanced over the wire. The inner dilator and wire were removed, and a 0.035 Bentson wire was advanced into the abdominal aorta. The micro sheath was removed and a standard 5 [place] vascular sheath was placed into the common femoral artery. The dilator was removed and the catheter was flushed.  A C2 Cobra catheter was advanced over the Bentson wire and used to select the left common iliac artery. The catheter was advanced to the distal common iliac artery.  Fluoroscopic guidance was then used to perform an anterior approach superior hypogastric block. A 20 cm, 22 gauge Chiba needle was advanced to the L5 level, just be low the C2 Cobra catheter in the arterial tree. The needle was advanced with the image intensifier in of about 12 degrees of caudal angulation. 1% lidocaine was used to anesthetize the skin and subcutaneous tissues to the peritoneal level.  Once the [facility] catheter was felt to be on the anterior surface of L5, oblique images confirmed location of the needle. The stylet was  removed and a small amount of contrast confirmed the needle tip in the paravertebral space. Approximately 15 cc of bupivacaine and dilute contrast was infused into the space under fluoroscopy. The needle was then removed.  The C2 Cobra catheter was then navigated with a Bentson wire into the internal iliac artery. Hand injection was used for road mapping of the internal iliac artery. The anterior division was identified, with a traditional downgoing branch of the urine artery.  Once identifying the left uterine artery, a high-flow Renegade catheter and Fathom wire were navigated to the distal vertical segment of the uterine artery. Angiogram  was performed.  We then embolized the left uterine artery using 500 - 700 Embospheres. Angiogram performed demonstrated 5 beat stasis at the completion of our embolization.  The C2 catheter was withdrawn into the common iliac artery and a Bentson wire was advanced to bring the tip of the C2 catheter into the distal external iliac artery. The Bentson wire was then reversed, with a stiff and used to form a Waltman loop.  The Waltman loop was then navigated first to the right internal iliac artery, with subsequent hand injection angiogram demonstrating a right-sided traditional arrangement of the urine artery. The Waltman loop was then engaged into the anterior division and right uterine artery. Angiogram of the right uterine artery was performed through the C2 catheter.  The renegade and Fathom combination were used to navigate to the horizontal segment of the right uterine artery. At the very proximal aspect of the horizontal segment of the right uterine artery, there was significant tortuosity. As the wire and catheter combination and gauge the proximal horizontal segment, vasospasm was induced. The vasospasm was treated with a total of 200 mcg of nitroglycerin.  With resolution of vasospasm, the catheter was stable at the proximal horizontal segment and we commenced  embolization with 500 -700 embospheres.  Completion angiogram demonstrates 5 beat stasis on the right, within the early divisions of the right urine artery. Because there were a few branches with stagnant column of contrast, embolization was terminated at this point.  Plain film of the pelvis demonstrated circumferential staining of the uterus with contrast.  The Waltman loop was on looped over the bifurcation of the aorta and withdrawn. Angiogram of the right common femoral artery was performed. The right common femoral artery was closed with an Exoseal closure device.  The patient tolerated the procedure well and remained hemodynamically stable throughout.  No complications were encountered and no significant blood loss was encountered.  COMPLICATIONS: None  FINDINGS: Bilateral uterine arteriography shows multiple enlarged hypervascular trunks supplying uterine fibroids.  Left uterine artery embolization was performed utilizing 1.5 vials of 500-700 micron sized particles. Completion arteriography demonstrates 5 beat stasis within the branches supplying uterine fibroids.  Right uterine artery embolization was performed utilizing 1.2 vials of 500 -700 Micron sized particles. Completion arteriography demonstrates 5 beat stasis of branches supplying uterine fibroids.  Adequate hemostasis was achieved at the femoral arteriotomy site after deployment of closure device.  Angiogram of the left uterine artery demonstrate early cervical vaginal branch at the proximal horizontal uterine artery. Navigating beyond this branch induced some vasospasm which resolved after nitroglycerin. Embolization was performed beyond this branch.  IMPRESSION: Status post microsphere embolization of bilateral uterine artery for symptomatic uterine fibroids.  Status post fluoroscopic guided superior hypogastric block.  Deployment of Exoseal device for right common femoral artery hemostasis.  Signed,  Dulcy Fanny. Earleen Newport DO  [facility]  [facility]  PLAN: The patient will be admitted for overnight observation. She will be observed for pain control as well as post embolization syndrome.  The patient will have her Foley catheter removed after 6 hr of bed rest or in the morning, whichever is her preference.  Currently the patient is receiving Q 6 hour Toradol and PCA pump.  Signed,  Dulcy Fanny. Earleen Newport, DO  [facility]  [facility] Radiology   Electronically Signed   By: Corrie Mckusick D.O.   On: 12/18/2013 11:42   Ir Angiogram Selective Each Additional Vessel  12/18/2013   CLINICAL DATA:  54 year old female with symptomatic uterine fibroids. The patient is experiencing 2 of the 3 classes of symptoms, including menometrorrhagia, and bulk type symptoms.  The patient presents today for bilateral uterine artery embolization.  EXAM: UTERINE FIBROID EMBOLIZATION  ULTRASOUND GUIDED RIGHT COMMON FEMORAL ARTERY ACCESS.  SUPERIOR HYPOGASTRIC NERVE BLOCK  BILATERAL UTERINE ARTERY ANGIOGRAM/PELVIC ARTERY ANGIOGRAM  CLOSURE DEVICE DEPLOYMENT FOR RIGHT COMMON FEMORAL ARTERY HEMOSTASIS  ANESTHESIA/SEDATION: 5.0 Mg IV Versed; 3.5 MG mg Dilaudid  Total Moderate Sedation Time: 117  Minutes.  Contrast Volume:  ml  Additional Medications: 2.0 g Ancef, 30 mg IV Toradol, 8.0 mg Zofran, 200 mcg nitroglycerin  FLUOROSCOPY TIME:  18 min, 48 second  PROCEDURE: The procedure, risks, benefits, and alternatives were explained to the patient. Questions regarding the procedure were encouraged and answered. The patient understands and consents to the procedure.  The right groin and periumbilical region were prepped with Betadine in a sterile fashion, and a sterile drape was applied covering the operative field. A sterile gown and sterile gloves were used for the procedure. Local anesthesia was provided with 1% Lidocaine.  Ultrasound survey of the right inguinal region was performed with images stored  and sent to PACs. Once the patient was anesthetized generously with 1% lidocaine in the right inguinal region, ultrasound guidance was used to access the right common femoral artery with a micropuncture needle. With excellent arterial blood flow returned, an 018 wire was advanced under fluoroscopy, observed enter the abdominal aorta. The needle was removed from the wire, and a micropuncture sheath was advanced over the wire. The inner dilator and wire were removed, and a 0.035 Bentson wire was advanced into the abdominal aorta. The micro sheath was removed and a standard 5 [place] vascular sheath was placed into the common femoral artery. The dilator was removed and the catheter was flushed.  A C2 Cobra catheter was advanced over the Bentson wire and used to select the left common iliac artery. The catheter was advanced to the distal common iliac artery.  Fluoroscopic guidance was then used to perform an anterior approach superior hypogastric block. A 20 cm, 22 gauge Chiba needle was advanced to the L5 level, just be low the C2 Cobra catheter in the arterial tree. The needle was advanced with the image intensifier in of about 12 degrees of caudal angulation. 1% lidocaine was used to anesthetize the skin and subcutaneous tissues to the peritoneal level.  Once the [facility] catheter was felt to be on the anterior surface of L5, oblique images confirmed location of the needle. The stylet was removed and a small amount of contrast confirmed the needle tip in the paravertebral space. Approximately 15 cc of bupivacaine and dilute contrast was infused into the space under fluoroscopy. The needle was then removed.  The C2 Cobra catheter was then navigated with a Bentson wire into the internal iliac artery. Hand injection was used for road mapping of the internal iliac artery. The anterior division was identified, with a traditional downgoing branch of the urine artery.  Once identifying the left uterine artery, a high-flow  Renegade catheter and Fathom wire were navigated to the distal vertical segment of the uterine artery. Angiogram was performed.  We then embolized the left uterine artery using 500 - 700 Embospheres. Angiogram performed demonstrated 5 beat stasis at the completion of our embolization.  The C2 catheter was withdrawn into the common iliac artery and a Bentson wire was advanced to bring the tip of the C2 catheter into the distal external iliac  artery. The Bentson wire was then reversed, with a stiff and used to form a Waltman loop.  The Waltman loop was then navigated first to the right internal iliac artery, with subsequent hand injection angiogram demonstrating a right-sided traditional arrangement of the urine artery. The Waltman loop was then engaged into the anterior division and right uterine artery. Angiogram of the right uterine artery was performed through the C2 catheter.  The renegade and Fathom combination were used to navigate to the horizontal segment of the right uterine artery. At the very proximal aspect of the horizontal segment of the right uterine artery, there was significant tortuosity. As the wire and catheter combination and gauge the proximal horizontal segment, vasospasm was induced. The vasospasm was treated with a total of 200 mcg of nitroglycerin.  With resolution of vasospasm, the catheter was stable at the proximal horizontal segment and we commenced embolization with 500 -700 embospheres.  Completion angiogram demonstrates 5 beat stasis on the right, within the early divisions of the right urine artery. Because there were a few branches with stagnant column of contrast, embolization was terminated at this point.  Plain film of the pelvis demonstrated circumferential staining of the uterus with contrast.  The Waltman loop was on looped over the bifurcation of the aorta and withdrawn. Angiogram of the right common femoral artery was performed. The right common femoral artery was closed  with an Exoseal closure device.  The patient tolerated the procedure well and remained hemodynamically stable throughout.  No complications were encountered and no significant blood loss was encountered.  COMPLICATIONS: None  FINDINGS: Bilateral uterine arteriography shows multiple enlarged hypervascular trunks supplying uterine fibroids.  Left uterine artery embolization was performed utilizing 1.5 vials of 500-700 micron sized particles. Completion arteriography demonstrates 5 beat stasis within the branches supplying uterine fibroids.  Right uterine artery embolization was performed utilizing 1.2 vials of 500 -700 Micron sized particles. Completion arteriography demonstrates 5 beat stasis of branches supplying uterine fibroids.  Adequate hemostasis was achieved at the femoral arteriotomy site after deployment of closure device.  Angiogram of the left uterine artery demonstrate early cervical vaginal branch at the proximal horizontal uterine artery. Navigating beyond this branch induced some vasospasm which resolved after nitroglycerin. Embolization was performed beyond this branch.  IMPRESSION: Status post microsphere embolization of bilateral uterine artery for symptomatic uterine fibroids.  Status post fluoroscopic guided superior hypogastric block.  Deployment of Exoseal device for right common femoral artery hemostasis.  Signed,  Dulcy Fanny. Earleen Newport DO  [facility]  [facility] Radiology  PLAN: The patient will be admitted for overnight observation. She will be observed for pain control as well as post embolization syndrome.  The patient will have her Foley catheter removed after 6 hr of bed rest or in the morning, whichever is her preference.  Currently the patient is receiving Q 6 hour Toradol and PCA pump.  Signed,  Dulcy Fanny. Earleen Newport, DO  [facility]  [facility]   Electronically Signed   By: Corrie Mckusick D.O.   On: 12/18/2013  11:42   Ir US Guide Vasc Access Right  12/18/2013   CLINICAL DATA:  54 year old female with symptomatic uterine fibroids. The patient is experiencing 2 of the 3 classes of symptoms, including menometrorrhagia, and bulk type symptoms.  The patient presents today for bilateral uterine artery embolization.  EXAM: UTERINE FIBROID EMBOLIZATION  ULTRASOUND GUIDED RIGHT COMMON FEMORAL ARTERY ACCESS.  SUPERIOR HYPOGASTRIC NERVE BLOCK  BILATERAL UTERINE ARTERY ANGIOGRAM/PELVIC  ARTERY ANGIOGRAM  CLOSURE DEVICE DEPLOYMENT FOR RIGHT COMMON FEMORAL ARTERY HEMOSTASIS  ANESTHESIA/SEDATION: 5.0 Mg IV Versed; 3.5 MG mg Dilaudid  Total Moderate Sedation Time: 117  Minutes.  Contrast Volume:  ml  Additional Medications: 2.0 g Ancef, 30 mg IV Toradol, 8.0 mg Zofran, 200 mcg nitroglycerin  FLUOROSCOPY TIME:  18 min, 48 second  PROCEDURE: The procedure, risks, benefits, and alternatives were explained to the patient. Questions regarding the procedure were encouraged and answered. The patient understands and consents to the procedure.  The right groin and periumbilical region were prepped with Betadine in a sterile fashion, and a sterile drape was applied covering the operative field. A sterile gown and sterile gloves were used for the procedure. Local anesthesia was provided with 1% Lidocaine.  Ultrasound survey of the right inguinal region was performed with images stored and sent to PACs. Once the patient was anesthetized generously with 1% lidocaine in the right inguinal region, ultrasound guidance was used to access the right common femoral artery with a micropuncture needle. With excellent arterial blood flow returned, an 018 wire was advanced under fluoroscopy, observed enter the abdominal aorta. The needle was removed from the wire, and a micropuncture sheath was advanced over the wire. The inner dilator and wire were removed, and a 0.035 Bentson wire was advanced into the abdominal aorta. The micro sheath was removed and a  standard 5 [place] vascular sheath was placed into the common femoral artery. The dilator was removed and the catheter was flushed.  A C2 Cobra catheter was advanced over the Bentson wire and used to select the left common iliac artery. The catheter was advanced to the distal common iliac artery.  Fluoroscopic guidance was then used to perform an anterior approach superior hypogastric block. A 20 cm, 22 gauge Chiba needle was advanced to the L5 level, just be low the C2 Cobra catheter in the arterial tree. The needle was advanced with the image intensifier in of about 12 degrees of caudal angulation. 1% lidocaine was used to anesthetize the skin and subcutaneous tissues to the peritoneal level.  Once the [facility] catheter was felt to be on the anterior surface of L5, oblique images confirmed location of the needle. The stylet was removed and a small amount of contrast confirmed the needle tip in the paravertebral space. Approximately 15 cc of bupivacaine and dilute contrast was infused into the space under fluoroscopy. The needle was then removed.  The C2 Cobra catheter was then navigated with a Bentson wire into the internal iliac artery. Hand injection was used for road mapping of the internal iliac artery. The anterior division was identified, with a traditional downgoing branch of the urine artery.  Once identifying the left uterine artery, a high-flow Renegade catheter and Fathom wire were navigated to the distal vertical segment of the uterine artery. Angiogram was performed.  We then embolized the left uterine artery using 500 - 700 Embospheres. Angiogram performed demonstrated 5 beat stasis at the completion of our embolization.  The C2 catheter was withdrawn into the common iliac artery and a Bentson wire was advanced to bring the tip of the C2 catheter into the distal external iliac artery. The Bentson wire was then reversed, with a stiff and used to form a Waltman loop.  The Waltman loop was then navigated  first to the right internal iliac artery, with subsequent hand injection angiogram demonstrating a right-sided traditional arrangement of the urine artery. The Waltman loop was then engaged into the anterior division and  right uterine artery. Angiogram of the right uterine artery was performed through the C2 catheter.  The renegade and Fathom combination were used to navigate to the horizontal segment of the right uterine artery. At the very proximal aspect of the horizontal segment of the right uterine artery, there was significant tortuosity. As the wire and catheter combination and gauge the proximal horizontal segment, vasospasm was induced. The vasospasm was treated with a total of 200 mcg of nitroglycerin.  With resolution of vasospasm, the catheter was stable at the proximal horizontal segment and we commenced embolization with 500 -700 embospheres.  Completion angiogram demonstrates 5 beat stasis on the right, within the early divisions of the right urine artery. Because there were a few branches with stagnant column of contrast, embolization was terminated at this point.  Plain film of the pelvis demonstrated circumferential staining of the uterus with contrast.  The Waltman loop was on looped over the bifurcation of the aorta and withdrawn. Angiogram of the right common femoral artery was performed. The right common femoral artery was closed with an Exoseal closure device.  The patient tolerated the procedure well and remained hemodynamically stable throughout.  No complications were encountered and no significant blood loss was encountered.  COMPLICATIONS: None  FINDINGS: Bilateral uterine arteriography shows multiple enlarged hypervascular trunks supplying uterine fibroids.  Left uterine artery embolization was performed utilizing 1.5 vials of 500-700 micron sized particles. Completion arteriography demonstrates 5 beat stasis within the branches supplying uterine fibroids.  Right uterine artery  embolization was performed utilizing 1.2 vials of 500 -700 Micron sized particles. Completion arteriography demonstrates 5 beat stasis of branches supplying uterine fibroids.  Adequate hemostasis was achieved at the femoral arteriotomy site after deployment of closure device.  Angiogram of the left uterine artery demonstrate early cervical vaginal branch at the proximal horizontal uterine artery. Navigating beyond this branch induced some vasospasm which resolved after nitroglycerin. Embolization was performed beyond this branch.  IMPRESSION: Status post microsphere embolization of bilateral uterine artery for symptomatic uterine fibroids.  Status post fluoroscopic guided superior hypogastric block.  Deployment of Exoseal device for right common femoral artery hemostasis.  Signed,  Dulcy Fanny. Earleen Newport DO  [facility]  [facility] Radiology  PLAN: The patient will be admitted for overnight observation. She will be observed for pain control as well as post embolization syndrome.  The patient will have her Foley catheter removed after 6 hr of bed rest or in the morning, whichever is her preference.  Currently the patient is receiving Q 6 hour Toradol and PCA pump.  Signed,  Dulcy Fanny. Earleen Newport, DO  [facility]  [facility] Radiology   Electronically Signed   By: Corrie Mckusick D.O.   On: 12/18/2013 11:42   Ir Fluoro Guide Ndl Plmt / Bx  12/18/2013   CLINICAL DATA:  54 year old female with symptomatic uterine fibroids. The patient is experiencing 2 of the 3 classes of symptoms, including menometrorrhagia, and bulk type symptoms.  The patient presents today for bilateral uterine artery embolization.  EXAM: UTERINE FIBROID EMBOLIZATION  ULTRASOUND GUIDED RIGHT COMMON FEMORAL ARTERY ACCESS.  SUPERIOR HYPOGASTRIC NERVE BLOCK  BILATERAL UTERINE ARTERY ANGIOGRAM/PELVIC ARTERY ANGIOGRAM  CLOSURE DEVICE DEPLOYMENT FOR RIGHT COMMON FEMORAL ARTERY HEMOSTASIS   ANESTHESIA/SEDATION: 5.0 Mg IV Versed; 3.5 MG mg Dilaudid  Total Moderate Sedation Time: 117  Minutes.  Contrast Volume:  ml  Additional Medications: 2.0 g Ancef, 30 mg IV Toradol, 8.0 mg Zofran, 200 mcg nitroglycerin  FLUOROSCOPY TIME:  18 min, 48 second  PROCEDURE: The procedure, risks, benefits, and alternatives were explained to the patient. Questions regarding the procedure were encouraged and answered. The patient understands and consents to the procedure.  The right groin and periumbilical region were prepped with Betadine in a sterile fashion, and a sterile drape was applied covering the operative field. A sterile gown and sterile gloves were used for the procedure. Local anesthesia was provided with 1% Lidocaine.  Ultrasound survey of the right inguinal region was performed with images stored and sent to PACs. Once the patient was anesthetized generously with 1% lidocaine in the right inguinal region, ultrasound guidance was used to access the right common femoral artery with a micropuncture needle. With excellent arterial blood flow returned, an 018 wire was advanced under fluoroscopy, observed enter the abdominal aorta. The needle was removed from the wire, and a micropuncture sheath was advanced over the wire. The inner dilator and wire were removed, and a 0.035 Bentson wire was advanced into the abdominal aorta. The micro sheath was removed and a standard 5 [place] vascular sheath was placed into the common femoral artery. The dilator was removed and the catheter was flushed.  A C2 Cobra catheter was advanced over the Bentson wire and used to select the left common iliac artery. The catheter was advanced to the distal common iliac artery.  Fluoroscopic guidance was then used to perform an anterior approach superior hypogastric block. A 20 cm, 22 gauge Chiba needle was advanced to the L5 level, just be low the C2 Cobra catheter in the arterial tree. The needle was advanced with the image intensifier in of  about 12 degrees of caudal angulation. 1% lidocaine was used to anesthetize the skin and subcutaneous tissues to the peritoneal level.  Once the [facility] catheter was felt to be on the anterior surface of L5, oblique images confirmed location of the needle. The stylet was removed and a small amount of contrast confirmed the needle tip in the paravertebral space. Approximately 15 cc of bupivacaine and dilute contrast was infused into the space under fluoroscopy. The needle was then removed.  The C2 Cobra catheter was then navigated with a Bentson wire into the internal iliac artery. Hand injection was used for road mapping of the internal iliac artery. The anterior division was identified, with a traditional downgoing branch of the urine artery.  Once identifying the left uterine artery, a high-flow Renegade catheter and Fathom wire were navigated to the distal vertical segment of the uterine artery. Angiogram was performed.  We then embolized the left uterine artery using 500 - 700 Embospheres. Angiogram performed demonstrated 5 beat stasis at the completion of our embolization.  The C2 catheter was withdrawn into the common iliac artery and a Bentson wire was advanced to bring the tip of the C2 catheter into the distal external iliac artery. The Bentson wire was then reversed, with a stiff and used to form a Waltman loop.  The Waltman loop was then navigated first to the right internal iliac artery, with subsequent hand injection angiogram demonstrating a right-sided traditional arrangement of the urine artery. The Waltman loop was then engaged into the anterior division and right uterine artery. Angiogram of the right uterine artery was performed through the C2 catheter.  The renegade and Fathom combination were used to navigate to the horizontal segment of the right uterine artery. At the very proximal aspect of the horizontal segment of the right uterine artery, there was significant tortuosity. As the wire and  catheter combination and gauge the proximal horizontal segment, vasospasm was induced. The vasospasm was treated with a total of 200 mcg of nitroglycerin.  With resolution of vasospasm, the catheter was stable at the proximal horizontal segment and we commenced embolization with 500 -700 embospheres.  Completion angiogram demonstrates 5 beat stasis on the right, within the early divisions of the right urine artery. Because there were a few branches with stagnant column of contrast, embolization was terminated at this point.  Plain film of the pelvis demonstrated circumferential staining of the uterus with contrast.  The Waltman loop was on looped over the bifurcation of the aorta and withdrawn. Angiogram of the right common femoral artery was performed. The right common femoral artery was closed with an Exoseal closure device.  The patient tolerated the procedure well and remained hemodynamically stable throughout.  No complications were encountered and no significant blood loss was encountered.  COMPLICATIONS: None  FINDINGS: Bilateral uterine arteriography shows multiple enlarged hypervascular trunks supplying uterine fibroids.  Left uterine artery embolization was performed utilizing 1.5 vials of 500-700 micron sized particles. Completion arteriography demonstrates 5 beat stasis within the branches supplying uterine fibroids.  Right uterine artery embolization was performed utilizing 1.2 vials of 500 -700 Micron sized particles. Completion arteriography demonstrates 5 beat stasis of branches supplying uterine fibroids.  Adequate hemostasis was achieved at the femoral arteriotomy site after deployment of closure device.  Angiogram of the left uterine artery demonstrate early cervical vaginal branch at the proximal horizontal uterine artery. Navigating beyond this branch induced some vasospasm which resolved after nitroglycerin. Embolization was performed beyond this branch.  IMPRESSION: Status post microsphere  embolization of bilateral uterine artery for symptomatic uterine fibroids.  Status post fluoroscopic guided superior hypogastric block.  Deployment of Exoseal device for right common femoral artery hemostasis.  Signed,  Dulcy Fanny. Earleen Newport DO  [facility]  [facility] Radiology  PLAN: The patient will be admitted for overnight observation. She will be observed for pain control as well as post embolization syndrome.  The patient will have her Foley catheter removed after 6 hr of bed rest or in the morning, whichever is her preference.  Currently the patient is receiving Q 6 hour Toradol and PCA pump.  Signed,  Dulcy Fanny. Earleen Newport, DO  [facility]  [facility] Radiology   Electronically Signed   By: Corrie Mckusick D.O.   On: 12/18/2013 11:42   Ir Embo Tumor Organ Ischemia Infarct Inc Guide Roadmapping  12/18/2013   CLINICAL DATA:  54 year old female with symptomatic uterine fibroids. The patient is experiencing 2 of the 3 classes of symptoms, including menometrorrhagia, and bulk type symptoms.  The patient presents today for bilateral uterine artery embolization.  EXAM: UTERINE FIBROID EMBOLIZATION  ULTRASOUND GUIDED RIGHT COMMON FEMORAL ARTERY ACCESS.  SUPERIOR HYPOGASTRIC NERVE BLOCK  BILATERAL UTERINE ARTERY ANGIOGRAM/PELVIC ARTERY ANGIOGRAM  CLOSURE DEVICE DEPLOYMENT FOR RIGHT COMMON FEMORAL ARTERY HEMOSTASIS  ANESTHESIA/SEDATION: 5.0 Mg IV Versed; 3.5 MG mg Dilaudid  Total Moderate Sedation Time: 117  Minutes.  Contrast Volume:  ml  Additional Medications: 2.0 g Ancef, 30 mg IV Toradol, 8.0 mg Zofran, 200 mcg nitroglycerin  FLUOROSCOPY TIME:  18 min, 48 second  PROCEDURE: The procedure, risks, benefits, and alternatives were explained to the patient. Questions regarding the procedure were encouraged and answered. The patient understands and consents to the procedure.  The right groin and periumbilical region were prepped with Betadine in a  sterile fashion, and a sterile drape was applied  covering the operative field. A sterile gown and sterile gloves were used for the procedure. Local anesthesia was provided with 1% Lidocaine.  Ultrasound survey of the right inguinal region was performed with images stored and sent to PACs. Once the patient was anesthetized generously with 1% lidocaine in the right inguinal region, ultrasound guidance was used to access the right common femoral artery with a micropuncture needle. With excellent arterial blood flow returned, an 018 wire was advanced under fluoroscopy, observed enter the abdominal aorta. The needle was removed from the wire, and a micropuncture sheath was advanced over the wire. The inner dilator and wire were removed, and a 0.035 Bentson wire was advanced into the abdominal aorta. The micro sheath was removed and a standard 5 [place] vascular sheath was placed into the common femoral artery. The dilator was removed and the catheter was flushed.  A C2 Cobra catheter was advanced over the Bentson wire and used to select the left common iliac artery. The catheter was advanced to the distal common iliac artery.  Fluoroscopic guidance was then used to perform an anterior approach superior hypogastric block. A 20 cm, 22 gauge Chiba needle was advanced to the L5 level, just be low the C2 Cobra catheter in the arterial tree. The needle was advanced with the image intensifier in of about 12 degrees of caudal angulation. 1% lidocaine was used to anesthetize the skin and subcutaneous tissues to the peritoneal level.  Once the [facility] catheter was felt to be on the anterior surface of L5, oblique images confirmed location of the needle. The stylet was removed and a small amount of contrast confirmed the needle tip in the paravertebral space. Approximately 15 cc of bupivacaine and dilute contrast was infused into the space under fluoroscopy. The needle was then removed.  The C2 Cobra catheter was then navigated with a  Bentson wire into the internal iliac artery. Hand injection was used for road mapping of the internal iliac artery. The anterior division was identified, with a traditional downgoing branch of the urine artery.  Once identifying the left uterine artery, a high-flow Renegade catheter and Fathom wire were navigated to the distal vertical segment of the uterine artery. Angiogram was performed.  We then embolized the left uterine artery using 500 - 700 Embospheres. Angiogram performed demonstrated 5 beat stasis at the completion of our embolization.  The C2 catheter was withdrawn into the common iliac artery and a Bentson wire was advanced to bring the tip of the C2 catheter into the distal external iliac artery. The Bentson wire was then reversed, with a stiff and used to form a Waltman loop.  The Waltman loop was then navigated first to the right internal iliac artery, with subsequent hand injection angiogram demonstrating a right-sided traditional arrangement of the urine artery. The [place] loop was then engaged into the anterior division and right uterine artery. Angiogram of the right uterine artery was performed through the C2 catheter.  The renegade and Fathom combination were used to navigate to the horizontal segment of the right uterine artery. At the very proximal aspect of the horizontal segment of the right uterine artery, there was significant tortuosity. As the wire and catheter combination and gauge the proximal horizontal segment, vasospasm was induced. The vasospasm was treated with a total of 200 mcg of nitroglycerin.  With resolution of vasospasm, the catheter was stable at the proximal horizontal segment and we commenced embolization with 500 -700 embospheres.  Completion angiogram demonstrates 5 beat stasis on the  right, within the early divisions of the right urine artery. Because there were a few branches with stagnant column of contrast, embolization was terminated at this point.  Plain film of  the pelvis demonstrated circumferential staining of the uterus with contrast.  The Waltman loop was on looped over the bifurcation of the aorta and withdrawn. Angiogram of the right common femoral artery was performed. The right common femoral artery was closed with an Exoseal closure device.  The patient tolerated the procedure well and remained hemodynamically stable throughout.  No complications were encountered and no significant blood loss was encountered.  COMPLICATIONS: None  FINDINGS: Bilateral uterine arteriography shows multiple enlarged hypervascular trunks supplying uterine fibroids.  Left uterine artery embolization was performed utilizing 1.5 vials of 500-700 micron sized particles. Completion arteriography demonstrates 5 beat stasis within the branches supplying uterine fibroids.  Right uterine artery embolization was performed utilizing 1.2 vials of 500 -700 Micron sized particles. Completion arteriography demonstrates 5 beat stasis of branches supplying uterine fibroids.  Adequate hemostasis was achieved at the femoral arteriotomy site after deployment of closure device.  Angiogram of the left uterine artery demonstrate early cervical vaginal branch at the proximal horizontal uterine artery. Navigating beyond this branch induced some vasospasm which resolved after nitroglycerin. Embolization was performed beyond this branch.  IMPRESSION: Status post microsphere embolization of bilateral uterine artery for symptomatic uterine fibroids.  Status post fluoroscopic guided superior hypogastric block.  Deployment of Exoseal device for right common femoral artery hemostasis.  Signed,  Dulcy Fanny. Earleen Newport DO  [facility]  [facility] Radiology  PLAN: The patient will be admitted for overnight observation. She will be observed for pain control as well as post embolization syndrome.  The patient will have her Foley catheter removed after 6 hr of bed rest or in the morning,  whichever is her preference.  Currently the patient is receiving Q 6 hour Toradol and PCA pump.  Signed,  Dulcy Fanny. Earleen Newport, DO  [facility]  [facility] Radiology   Electronically Signed   By: Corrie Mckusick D.O.   On: 12/18/2013 11:42    Labs:  CBC:  Recent Labs  12/18/13 0750  WBC 6.5  HGB 10.6*  HCT 32.5*  PLT 341    COAGS:  Recent Labs  12/18/13 0750  INR 1.11  APTT 27    BMP:  Recent Labs  12/18/13 0750  NA 139  K 4.0  CL 104  CO2 23  GLUCOSE 92  BUN 16  CALCIUM 9.2  CREATININE 0.93  GFRNONAA 69*  GFRAA 80*    LIVER FUNCTION TESTS: No results for input(s): BILITOT, AST, ALT, ALKPHOS, PROT, ALBUMIN in the last 8760 hours.  TUMOR MARKERS: No results for input(s): AFPTM, CEA, CA199, CHROMGRNA in the last 8760 hours.  Assessment and Plan:  54 year old female status post bilateral uterine artery embolization for symptomatic fibroids, within the first month of treatment (treatment = 12/18/13.)    No problems or concerns, with only spotting and no significant bleeding or worrisome discharge.    Continue current plan of care, which includes a 6 month follow up visit at [facility], at which time there will be an assessment for the appropriateness of further imaging.    Sylvia Henderson is encouraged to continue her activities of daily living without modification.        I spent a total of 25 minutes face to face in clinical consultation, greater than 50% of which was counseling/coordinating care  for her symptomatic uterine fibroids, status post bilateral uterine artery embolization.  Signed,  Dulcy Fanny. Earleen Newport, DO    Sylvia Henderson 01/14/2014, 10:07 AM

## 2014-01-26 ENCOUNTER — Telehealth: Payer: Self-pay | Admitting: Emergency Medicine

## 2014-01-26 NOTE — Telephone Encounter (Signed)
Pt called w/ c/c of cramping, pain and mild fever of 100, which goes up and down.  Has a call in to her GYN also.  I will have Dr Earleen Newport call her Wed. Morning to ck her status.

## 2014-01-27 ENCOUNTER — Telehealth: Payer: Self-pay | Admitting: Interventional Radiology

## 2014-01-27 NOTE — Telephone Encounter (Signed)
Patient is SP bilateral [place] for symptomatic uterine fibroids, completed 12/18/2013.    The patient calls with complaints of "cramping" abdominal pain.  Upon return phone call, she is feeling much better, having been seen by her OB/GYN.  She reports that she has been placed onto a course of abx for a potential infection, and that she has been taking analgesics which are giving her relief.  She denies any current fevers rigors or chills.    She is satisfied now to continue with the plan of care, and that she has been feeling better.   She understands the need to contact our office or that of her OB/GYN in the future should she experience any signs of infection.

## 2014-02-01 ENCOUNTER — Telehealth: Payer: Self-pay | Admitting: Emergency Medicine

## 2014-02-01 NOTE — Telephone Encounter (Signed)
Returned call to pt. From message left on Friday- pt stated that she passed a huge clot/ fibroid "10cm" (as she took a pic of it and measured it) and she is feeling much better since she passed it.  Pt stated that she saw the GYN- NP last week and her WBC was 17.3, so they RX her probenecid- 500mg  x2, Doxycycline-100mg - 1q 12hrs x 14d,  They did reck her WBC and it came down to 15.  She was instructed to take Diflucan 150mg  post taking the Anti-B to prevent UTI.   The GYN will not do anything else until Dr Earleen Newport phones the office- [phone]. ( Dr Nelda Marseille)  Pt wants to know where does she go from here? Will notify Dr Earleen Newport.

## 2014-03-15 ENCOUNTER — Telehealth: Payer: Self-pay | Admitting: Radiology

## 2014-03-15 NOTE — Telephone Encounter (Signed)
Patient doing well 3 mos s/p [place].  States that her GYN performed US Pelvis approx 2 weeks ago & this showed that the uterus is normal in size.  Has not experienced menstrual cycle post [place] & no bleeding at this time.    Patient competed in marathon run on 03/14/2014 and is doing well overall.  Will contact patient at 6 months to schedule follow up appointment with Dr Corrie Mckusick.  Anie Juniel Riki Rusk, RN 03/15/2014 2:50 PM

## 2014-06-08 ENCOUNTER — Telehealth: Payer: Self-pay | Admitting: Radiology

## 2014-06-08 NOTE — Telephone Encounter (Signed)
Patient states that she is doing great 6 mo post [place].  States that the procedure has "made a huge difference in my life".   Does not feel that she needs to be seen for follow up at present.  Instructed to call should she have any future concerns re:  [place].  Janos Shampine Riki Rusk, RN 06/08/2014 3:21 PM

## 2014-12-16 ENCOUNTER — Ambulatory Visit (INDEPENDENT_AMBULATORY_CARE_PROVIDER_SITE_OTHER): Payer: Managed Care, Other (non HMO) | Admitting: Podiatry

## 2014-12-16 ENCOUNTER — Encounter: Payer: Self-pay | Admitting: Podiatry

## 2014-12-16 DIAGNOSIS — B351 Tinea unguium: Secondary | ICD-10-CM | POA: Diagnosis not present

## 2014-12-16 NOTE — Progress Notes (Signed)
   Subjective:    Patient ID: Sylvia Henderson, female    DOB: [birth year], 54 y.o.   MRN: [id number]  HPI this patient presents to the office with concerns about her toenails on her right foot. She states that she is a avid marathon runner and has had female concerns for the last 6 months. She states that the big toenail on her right foot has developed white areas on the top of the nail. She denies any trauma or injury or drainage from this toenail. She also believes she may have a wart on the fourth and fifth toes of the nailbed of the right foot. She presents to the office for an evaluation and treatment of her nails The patient is here today with" right great toenail that is discolored since 6 months ago and a right 4th toe  that has a possible wart on the nail bed."   Review of Systems  Skin:       Change in nails  All other systems reviewed and are negative.      Objective:   Physical Exam GENERAL APPEARANCE: Alert, conversant. Appropriately groomed. No acute distress.  VASCULAR: Pedal pulses palpable at  [facility] and PT bilateral.  Capillary refill time is immediate to all digits,  Normal temperature gradient.  Digital hair growth is present bilateral  NEUROLOGIC: sensation is normal to 5.07 monofilament at 5/5 sites bilateral.  Light touch is intact bilateral, Muscle strength normal.  MUSCULOSKELETAL: acceptable muscle strength, tone and stability bilateral.  Intrinsic muscluature intact bilateral.  Rectus appearance of foot and digits noted bilateral.   DERMATOLOGIC: skin color, texture, and turgor are within normal limits.  No preulcerative lesions or ulcers  are seen, no interdigital maceration noted.  No open lesions present.  Digital nails are asymptomatic. No drainage noted. White superficial spreading nail fungus on right hallux toenail  She has deformed and discolored nails on fourth and fifth  toes right foot.  No redness or infection noted.         Assessment & Plan:    Onychomycosis great hallux.  Trauma to 4,5 toenails right foot.  IE  Prescribed formula 3 for nail usage.  RTC prn  Gardiner Barefoot DPM

## 2015-05-06 IMAGING — US IR EMBO ARTERIAL NOT [PERSON_NAME] INC GUIDE ROADMAPPING
1 series · 2 of 2 positions shown · non-contrast
Comparison: none

CLINICAL DATA: 53-year-old female with symptomatic uterine
fibroids. The patient is experiencing 2 of the 3 classes of
symptoms, including menometrorrhagia, and bulk type symptoms.

[Series 1: ir embo arterial not (person_name) inc guide roadm · arterial · 2 of 2 slices shown]
[im 1/2]
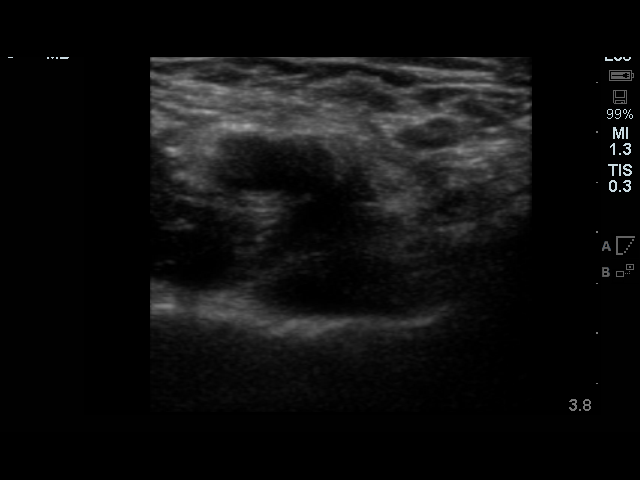
[im 2/2]
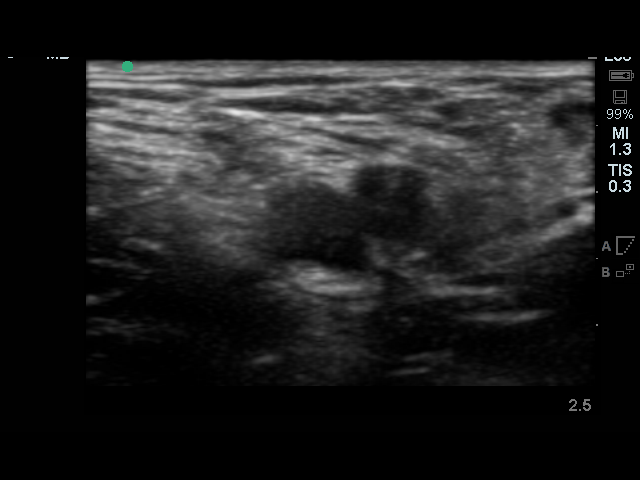

[2 of 2 positions shown; findings below may reference images not displayed]

The patient presents today for bilateral uterine artery
embolization.

EXAM:
UTERINE FIBROID EMBOLIZATION

ULTRASOUND GUIDED RIGHT COMMON FEMORAL ARTERY ACCESS.

SUPERIOR HYPOGASTRIC NERVE BLOCK

BILATERAL UTERINE ARTERY ANGIOGRAM/PELVIC ARTERY ANGIOGRAM

CLOSURE DEVICE DEPLOYMENT FOR RIGHT COMMON FEMORAL ARTERY HEMOSTASIS

ANESTHESIA/SEDATION:
5.0 Mg IV Versed; 3.5 MG mg Dilaudid

Total Moderate Sedation Time: 117  Minutes.

Contrast Volume:  ml

Additional Medications: 2.0 g Ancef, 30 mg IV Toradol, 8.0 mg
Zofran, 200 mcg nitroglycerin

FLUOROSCOPY TIME:  18 min, 48 second

PROCEDURE:
The procedure, risks, benefits, and alternatives were explained to
the patient. Questions regarding the procedure were encouraged and
answered. The patient understands and consents to the procedure.

The right groin and periumbilical region were prepped with Betadine
in a sterile fashion, and a sterile drape was applied covering the
operative field. A sterile gown and sterile gloves were used for the
procedure. Local anesthesia was provided with 1% Lidocaine.

Ultrasound survey of the right inguinal region was performed with
images stored and sent to PACs. Once the patient was anesthetized
generously with 1% lidocaine in the right inguinal region,
ultrasound guidance was used to access the right common femoral
artery with a micropuncture needle. With excellent arterial blood
flow returned, an 018 wire was advanced under fluoroscopy, observed
enter the abdominal aorta. The needle was removed from the wire, and
a micropuncture sheath was advanced over the wire. The inner dilator
and wire were removed, and a 0.035 Bentson wire was advanced into
the abdominal aorta. The micro sheath was removed and a standard 5
[place] vascular sheath was placed into the common femoral artery.
The dilator was removed and the catheter was flushed.

A C2 Cobra catheter was advanced over the Bentson wire and used to
select the left common iliac artery. The catheter was advanced to
the distal common iliac artery.

Fluoroscopic guidance was then used to perform an anterior approach
superior hypogastric block. A 20 cm, 22 gauge Chiba needle was
advanced to the L5 level, just be low the C2 Cobra catheter in the
arterial tree. The needle was advanced with the image intensifier in
of about 12 degrees of caudal angulation. 1% lidocaine was used to
anesthetize the skin and subcutaneous tissues to the peritoneal
level.

Once the [place] catheter was felt to be on the anterior surface of
L5, oblique images confirmed location of the needle. The stylet was
removed and a small amount of contrast confirmed the needle tip in
the paravertebral space. Approximately 15 cc of bupivacaine and
dilute contrast was infused into the space under fluoroscopy. The
needle was then removed.

The C2 Cobra catheter was then navigated with a Bentson wire into
the internal iliac artery. Hand injection was used for road mapping
of the internal iliac artery. The anterior division was identified,
with a traditional downgoing branch of the urine artery.

Once identifying the left uterine artery, a high-flow Renegade
catheter and Fathom wire were navigated to the distal vertical
segment of the uterine artery. Angiogram was performed.

We then embolized the left uterine artery using 500 - 700
Embospheres. Angiogram performed demonstrated 5 beat stasis at the
completion of our embolization.

The C2 catheter was withdrawn into the common iliac artery and a
Bentson wire was advanced to bring the tip of the C2 catheter into
the distal external iliac artery. The Bentson wire was then
reversed, with a stiff and used to form S[place]a Pabla loop.

The [place] loop was then navigated first to the right internal
iliac artery, with subsequent hand injection angiogram demonstrating
a right-sided traditional arrangement of the urine artery. The
[place] loop was then engaged into the anterior division and right
uterine artery. Angiogram of the right uterine artery was performed
through the C2 catheter.

The renegade and Fathom combination were used to navigate to the
horizontal segment of the right uterine artery. At the very proximal
aspect of the horizontal segment of the right uterine artery, there
was significant tortuosity. As the wire and catheter combination and
gauge the proximal horizontal segment, vasospasm was induced. The
vasospasm was treated with a total of 200 mcg of nitroglycerin.

With resolution of vasospasm, the catheter was stable at the
proximal horizontal segment and we commenced embolization with 500
-700 embospheres.

Completion angiogram demonstrates 5 beat stasis on the right, within
the early divisions of the right urine artery. Because there were a
few branches with stagnant column of contrast, embolization was
terminated at this point.

Plain film of the pelvis demonstrated circumferential staining of
the uterus with contrast.

The [place] loop was on looped over the bifurcation of the aorta and
withdrawn. Angiogram of the right common femoral artery was
performed. The right common femoral artery was closed with an
Exoseal closure device.

The patient tolerated the procedure well and remained
hemodynamically stable throughout.

No complications were encountered and no significant blood loss was
encountered.

COMPLICATIONS:
None
FINDINGS: Bilateral uterine arteriography shows multiple enlarged
hypervascular trunks supplying uterine fibroids.

Left uterine artery embolization was performed utilizing 1.5 vials
of 500-700 micron sized particles. Completion arteriography
demonstrates 5 beat stasis within the branches supplying uterine
fibroids.

Right uterine artery embolization was performed utilizing 1.2 vials
of 500 -700 Micron sized particles. Completion arteriography
demonstrates 5 beat stasis of branches supplying uterine fibroids.

Adequate hemostasis was achieved at the femoral arteriotomy site
after deployment of closure device.

Angiogram of the left uterine artery demonstrate early cervical
vaginal branch at the proximal horizontal uterine artery. Navigating
beyond this branch induced some vasospasm which resolved after
nitroglycerin. Embolization was performed beyond this branch.
IMPRESSION: Status post microsphere embolization of bilateral uterine artery for
symptomatic uterine fibroids.

Status post fluoroscopic guided superior hypogastric block.

Deployment of Exoseal device for right common femoral artery
hemostasis.

PLAN:
The patient will be admitted for overnight observation. She will be
observed for pain control as well as post embolization syndrome.

The patient will have her Foley catheter removed after 6 hr of bed
rest or in the morning, whichever is her preference.

Currently the patient is receiving Q 6 hour Toradol and PCA pump.

## 2016-01-04 ENCOUNTER — Encounter: Payer: Self-pay | Admitting: Interventional Radiology
